# Patient Record
Sex: Male | Born: 1945
Health system: Southern US, Community
[De-identification: ages and names within clinical notes are randomized; demographics above are authoritative.]

## PROBLEM LIST (undated history)

## (undated) DIAGNOSIS — M1990 Unspecified osteoarthritis, unspecified site: Secondary | ICD-10-CM

---

## 2007-01-02 ENCOUNTER — Emergency Department (HOSPITAL_COMMUNITY): Admission: EM | Admit: 2007-01-02 | Discharge: 2007-01-03 | Payer: Self-pay | Admitting: Emergency Medicine

## 2010-06-09 ENCOUNTER — Emergency Department (HOSPITAL_COMMUNITY): Admission: EM | Admit: 2010-06-09 | Discharge: 2010-06-09 | Payer: Self-pay | Admitting: Emergency Medicine

## 2010-10-04 ENCOUNTER — Encounter: Payer: Self-pay | Admitting: *Deleted

## 2013-12-26 ENCOUNTER — Other Ambulatory Visit: Payer: Self-pay | Admitting: Family Medicine

## 2013-12-26 DIAGNOSIS — M545 Low back pain, unspecified: Secondary | ICD-10-CM

## 2013-12-30 ENCOUNTER — Ambulatory Visit
Admission: RE | Admit: 2013-12-30 | Discharge: 2013-12-30 | Disposition: A | Discharge status: Home / Self Care | Payer: Medicare Other | Source: Ambulatory Visit | Attending: Family Medicine | Admitting: Family Medicine

## 2013-12-30 DIAGNOSIS — M545 Low back pain, unspecified: Secondary | ICD-10-CM

## 2015-06-18 DIAGNOSIS — M4726 Other spondylosis with radiculopathy, lumbar region: Secondary | ICD-10-CM | POA: Diagnosis not present

## 2015-06-18 DIAGNOSIS — M9903 Segmental and somatic dysfunction of lumbar region: Secondary | ICD-10-CM | POA: Diagnosis not present

## 2015-06-25 DIAGNOSIS — M9903 Segmental and somatic dysfunction of lumbar region: Secondary | ICD-10-CM | POA: Diagnosis not present

## 2015-06-25 DIAGNOSIS — M4726 Other spondylosis with radiculopathy, lumbar region: Secondary | ICD-10-CM | POA: Diagnosis not present

## 2015-06-29 DIAGNOSIS — M4726 Other spondylosis with radiculopathy, lumbar region: Secondary | ICD-10-CM | POA: Diagnosis not present

## 2015-06-29 DIAGNOSIS — M9903 Segmental and somatic dysfunction of lumbar region: Secondary | ICD-10-CM | POA: Diagnosis not present

## 2015-06-30 DIAGNOSIS — M9903 Segmental and somatic dysfunction of lumbar region: Secondary | ICD-10-CM | POA: Diagnosis not present

## 2015-06-30 DIAGNOSIS — M4726 Other spondylosis with radiculopathy, lumbar region: Secondary | ICD-10-CM | POA: Diagnosis not present

## 2015-07-02 DIAGNOSIS — M9903 Segmental and somatic dysfunction of lumbar region: Secondary | ICD-10-CM | POA: Diagnosis not present

## 2015-07-02 DIAGNOSIS — M4726 Other spondylosis with radiculopathy, lumbar region: Secondary | ICD-10-CM | POA: Diagnosis not present

## 2015-07-06 DIAGNOSIS — M4726 Other spondylosis with radiculopathy, lumbar region: Secondary | ICD-10-CM | POA: Diagnosis not present

## 2015-07-06 DIAGNOSIS — M9903 Segmental and somatic dysfunction of lumbar region: Secondary | ICD-10-CM | POA: Diagnosis not present

## 2015-07-07 DIAGNOSIS — M4726 Other spondylosis with radiculopathy, lumbar region: Secondary | ICD-10-CM | POA: Diagnosis not present

## 2015-07-07 DIAGNOSIS — M9903 Segmental and somatic dysfunction of lumbar region: Secondary | ICD-10-CM | POA: Diagnosis not present

## 2015-07-09 DIAGNOSIS — M9903 Segmental and somatic dysfunction of lumbar region: Secondary | ICD-10-CM | POA: Diagnosis not present

## 2015-07-09 DIAGNOSIS — M4726 Other spondylosis with radiculopathy, lumbar region: Secondary | ICD-10-CM | POA: Diagnosis not present

## 2015-07-14 DIAGNOSIS — M9903 Segmental and somatic dysfunction of lumbar region: Secondary | ICD-10-CM | POA: Diagnosis not present

## 2015-07-14 DIAGNOSIS — M4726 Other spondylosis with radiculopathy, lumbar region: Secondary | ICD-10-CM | POA: Diagnosis not present

## 2015-07-15 DIAGNOSIS — M9903 Segmental and somatic dysfunction of lumbar region: Secondary | ICD-10-CM | POA: Diagnosis not present

## 2015-07-15 DIAGNOSIS — M4726 Other spondylosis with radiculopathy, lumbar region: Secondary | ICD-10-CM | POA: Diagnosis not present

## 2015-07-20 DIAGNOSIS — M4726 Other spondylosis with radiculopathy, lumbar region: Secondary | ICD-10-CM | POA: Diagnosis not present

## 2015-07-20 DIAGNOSIS — M9903 Segmental and somatic dysfunction of lumbar region: Secondary | ICD-10-CM | POA: Diagnosis not present

## 2015-07-21 DIAGNOSIS — M4726 Other spondylosis with radiculopathy, lumbar region: Secondary | ICD-10-CM | POA: Diagnosis not present

## 2015-07-21 DIAGNOSIS — M9903 Segmental and somatic dysfunction of lumbar region: Secondary | ICD-10-CM | POA: Diagnosis not present

## 2015-07-22 DIAGNOSIS — M4726 Other spondylosis with radiculopathy, lumbar region: Secondary | ICD-10-CM | POA: Diagnosis not present

## 2015-07-22 DIAGNOSIS — M9903 Segmental and somatic dysfunction of lumbar region: Secondary | ICD-10-CM | POA: Diagnosis not present

## 2017-01-25 ENCOUNTER — Emergency Department (HOSPITAL_COMMUNITY)
Admission: EM | Admit: 2017-01-25 | Discharge: 2017-01-25 | Disposition: A | Discharge status: Home / Self Care | Payer: Medicare Other | Attending: Emergency Medicine | Admitting: Emergency Medicine

## 2017-01-25 ENCOUNTER — Encounter (HOSPITAL_COMMUNITY): Payer: Self-pay | Admitting: Emergency Medicine

## 2017-01-25 DIAGNOSIS — R112 Nausea with vomiting, unspecified: Secondary | ICD-10-CM | POA: Insufficient documentation

## 2017-01-25 DIAGNOSIS — R110 Nausea: Secondary | ICD-10-CM

## 2017-01-25 DIAGNOSIS — R55 Syncope and collapse: Secondary | ICD-10-CM | POA: Diagnosis not present

## 2017-01-25 DIAGNOSIS — R531 Weakness: Secondary | ICD-10-CM | POA: Diagnosis not present

## 2017-01-25 DIAGNOSIS — R197 Diarrhea, unspecified: Secondary | ICD-10-CM | POA: Diagnosis not present

## 2017-01-25 DIAGNOSIS — R404 Transient alteration of awareness: Secondary | ICD-10-CM | POA: Diagnosis not present

## 2017-01-25 LAB — COMPREHENSIVE METABOLIC PANEL
ALT: 25 U/L (ref 17–63)
AST: 25 U/L (ref 15–41)
Albumin: 3.9 g/dL (ref 3.5–5.0)
Alkaline Phosphatase: 68 U/L (ref 38–126)
Anion gap: 7 (ref 5–15)
BUN: 16 mg/dL (ref 6–20)
CO2: 31 mmol/L (ref 22–32)
Calcium: 9.2 mg/dL (ref 8.9–10.3)
Chloride: 104 mmol/L (ref 101–111)
Creatinine, Ser: 1.19 mg/dL (ref 0.61–1.24)
GFR calc Af Amer: >60 mL/min (ref >60)
GFR calc non Af Amer: 60 mL/min — ABNORMAL LOW (ref >60)
Glucose, Bld: 110 mg/dL — ABNORMAL HIGH (ref 65–99)
Potassium: 3.7 mmol/L (ref 3.5–5.1)
Sodium: 142 mmol/L (ref 135–145)
Total Bilirubin: 0.5 mg/dL (ref 0.3–1.2)
Total Protein: 6.9 g/dL (ref 6.5–8.1)

## 2017-01-25 LAB — CBC WITH DIFFERENTIAL/PLATELET
Basophils Absolute: 0 10*3/uL (ref 0.0–0.1)
Basophils Relative: 0 %
Eosinophils Absolute: 0.1 10*3/uL (ref 0.0–0.7)
Eosinophils Relative: 1 %
HCT: 40.9 % (ref 39.0–52.0)
Hemoglobin: 14.3 g/dL (ref 13.0–17.0)
Lymphocytes Relative: 10 %
Lymphs Abs: 0.7 10*3/uL (ref 0.7–4.0)
MCH: 30.1 pg (ref 26.0–34.0)
MCHC: 35 g/dL (ref 30.0–36.0)
MCV: 86.1 fL (ref 78.0–100.0)
Monocytes Absolute: 0.5 10*3/uL (ref 0.1–1.0)
Monocytes Relative: 6 %
Neutro Abs: 6 10*3/uL (ref 1.7–7.7)
Neutrophils Relative %: 83 %
Platelets: 126 10*3/uL — ABNORMAL LOW (ref 150–400)
RBC: 4.75 MIL/uL (ref 4.22–5.81)
RDW: 13.6 % (ref 11.5–15.5)
WBC: 7.2 10*3/uL (ref 4.0–10.5)

## 2017-01-25 LAB — LIPASE, BLOOD: Lipase: 13 U/L (ref 11–51)

## 2017-01-25 MED ORDER — ONDANSETRON HCL 4 MG/2ML IJ SOLN
4.0000 mg | Freq: Once | INTRAMUSCULAR | Status: DC
  Filled 2017-01-25: qty 2

## 2017-01-25 MED ORDER — SODIUM CHLORIDE 0.9 % IV BOLUS (SEPSIS)
1000.0000 mL | Freq: Once | INTRAVENOUS | Status: AC
  Administered 2017-01-25: 1000 mL via INTRAVENOUS

## 2017-01-25 MED ORDER — ONDANSETRON 8 MG PO TBDP: 8.0000 mg | ORAL_TABLET | Freq: Three times a day (TID) | ORAL | 0 refills | Status: DC | PRN

## 2017-01-25 NOTE — ED Notes (Signed)
Bed: ZO10WA22 Expected date:  Expected time:  Means of arrival:  Comments: EMS/weak

## 2017-01-25 NOTE — ED Triage Notes (Signed)
Per [facility] pt from [facility] for [facility] problem began feeling dizzy and had a couple episodes of emesis. Pt denies chest pain or shortness of breath. CBG 126

## 2017-01-25 NOTE — ED Provider Notes (Signed)
[facility]-EMERGENCY DEPT Provider Note   CSN: [id number] Arrival date & time: 01/25/17  1228     History   Chief Complaint Chief Complaint  Patient presents with  . Emesis  . Dizziness    HPI Gerald Whitaker is a 71 y.o. male.  71 year old male presents with acute onset of nausea and diarrhea that occurred at the physical therapy. States that he was to initiate usual physical therapy for his right hip when he came nauseated. Sent on the toilet and stated that he had emesis as well as diarrhea at the same time. States that he has been constipated for several days but took a laxative and states that he had a good result today. Patient has had this before in the past without diagnosis. Denies any fever or chills. Denies any actual abdominal pain.      History reviewed. No pertinent past medical history.  There are no active problems to display for this patient.   History reviewed. No pertinent surgical history.     Home Medications    Prior to Admission medications   Not on File    Family History No family history on file.  Social History Social History  Substance Use Topics  . Smoking status: Not on file  . Smokeless tobacco: Not on file  . Alcohol use Not on file     Allergies   Patient has no known allergies.   Review of Systems Review of Systems  All other systems reviewed and are negative.    Physical Exam Updated Vital Signs BP 132/67 (BP Location: Right Arm)   Pulse 65   Temp 97.5 F (36.4 C)   Ht 6' (1.829 m)   Wt 90.7 kg   SpO2 96%   BMI 27.12 kg/m   Physical Exam  Constitutional: He is oriented to person, place, and time. He appears well-developed and well-nourished.  Non-toxic appearance. No distress.  HENT:  Head: Normocephalic and atraumatic.  Eyes: Conjunctivae, EOM and lids are normal. Pupils are equal, round, and reactive to light.  Neck: Normal range of motion. Neck supple. No tracheal deviation present. No thyroid mass present.    Cardiovascular: Normal rate, regular rhythm and normal heart sounds.  Exam reveals no gallop.   No murmur heard. Pulmonary/Chest: Effort normal and breath sounds normal. No stridor. No respiratory distress. He has no decreased breath sounds. He has no wheezes. He has no rhonchi. He has no rales.  Abdominal: Soft. Normal appearance and bowel sounds are normal. He exhibits no distension. There is no tenderness. There is no rebound and no CVA tenderness.  Musculoskeletal: Normal range of motion. He exhibits no edema or tenderness.  Neurological: He is alert and oriented to person, place, and time. He has normal strength. No cranial nerve deficit or sensory deficit. GCS eye subscore is 4. GCS verbal subscore is 5. GCS motor subscore is 6.  Skin: Skin is warm and dry. No abrasion and no rash noted.  Psychiatric: He has a normal mood and affect. His speech is normal and behavior is normal.  Nursing note and vitals reviewed.    ED Treatments / Results  Labs (all labs ordered are listed, but only abnormal results are displayed) Labs Reviewed  CBC WITH DIFFERENTIAL/PLATELET  COMPREHENSIVE METABOLIC PANEL  LIPASE, BLOOD    EKG  EKG Interpretation None       Radiology No results found.  Procedures Procedures (including critical care time)  Medications Ordered in ED Medications  sodium chloride 0.9 %  bolus 1,000 mL (not administered)  ondansetron (ZOFRAN) injection 4 mg (not administered)     Initial Impression / Assessment and Plan / ED Course  I have reviewed the triage vital signs and the nursing notes.  Pertinent labs & imaging results that were available during my care of the patient were reviewed by me and considered in my medical decision making (see chart for details).     Patient given IV fluids here feels better. Abdominal exam is benign. Labs are reassuring. Stable for discharge  Final Clinical Impressions(s) / ED Diagnoses   Final diagnoses:  None    New  Prescriptions New Prescriptions   No medications on file     Lorre NickAllen, Yuliza Cara, MD 01/25/17 1506

## 2017-05-31 DIAGNOSIS — M4726 Other spondylosis with radiculopathy, lumbar region: Secondary | ICD-10-CM | POA: Diagnosis not present

## 2017-05-31 DIAGNOSIS — M9903 Segmental and somatic dysfunction of lumbar region: Secondary | ICD-10-CM | POA: Diagnosis not present

## 2017-06-01 DIAGNOSIS — M9903 Segmental and somatic dysfunction of lumbar region: Secondary | ICD-10-CM | POA: Diagnosis not present

## 2017-06-01 DIAGNOSIS — M4726 Other spondylosis with radiculopathy, lumbar region: Secondary | ICD-10-CM | POA: Diagnosis not present

## 2017-06-08 DIAGNOSIS — M9903 Segmental and somatic dysfunction of lumbar region: Secondary | ICD-10-CM | POA: Diagnosis not present

## 2017-06-08 DIAGNOSIS — M4726 Other spondylosis with radiculopathy, lumbar region: Secondary | ICD-10-CM | POA: Diagnosis not present

## 2017-08-18 DIAGNOSIS — M160 Bilateral primary osteoarthritis of hip: Secondary | ICD-10-CM | POA: Diagnosis not present

## 2017-11-22 DIAGNOSIS — M62830 Muscle spasm of back: Secondary | ICD-10-CM | POA: Diagnosis not present

## 2017-11-22 DIAGNOSIS — M9904 Segmental and somatic dysfunction of sacral region: Secondary | ICD-10-CM | POA: Diagnosis not present

## 2017-11-22 DIAGNOSIS — M25552 Pain in left hip: Secondary | ICD-10-CM | POA: Diagnosis not present

## 2017-11-22 DIAGNOSIS — M25551 Pain in right hip: Secondary | ICD-10-CM | POA: Diagnosis not present

## 2017-11-22 DIAGNOSIS — M1612 Unilateral primary osteoarthritis, left hip: Secondary | ICD-10-CM | POA: Diagnosis not present

## 2019-08-05 DIAGNOSIS — K30 Functional dyspepsia: Secondary | ICD-10-CM | POA: Diagnosis not present

## 2019-08-05 DIAGNOSIS — Z125 Encounter for screening for malignant neoplasm of prostate: Secondary | ICD-10-CM | POA: Diagnosis not present

## 2019-08-05 DIAGNOSIS — M25559 Pain in unspecified hip: Secondary | ICD-10-CM | POA: Diagnosis not present

## 2019-08-05 DIAGNOSIS — Z1331 Encounter for screening for depression: Secondary | ICD-10-CM | POA: Diagnosis not present

## 2019-08-05 DIAGNOSIS — N401 Enlarged prostate with lower urinary tract symptoms: Secondary | ICD-10-CM | POA: Diagnosis not present

## 2019-08-05 DIAGNOSIS — H9319 Tinnitus, unspecified ear: Secondary | ICD-10-CM | POA: Diagnosis not present

## 2019-08-05 DIAGNOSIS — M545 Low back pain: Secondary | ICD-10-CM | POA: Diagnosis not present

## 2019-08-05 DIAGNOSIS — R300 Dysuria: Secondary | ICD-10-CM | POA: Diagnosis not present

## 2019-08-05 DIAGNOSIS — R7989 Other specified abnormal findings of blood chemistry: Secondary | ICD-10-CM | POA: Diagnosis not present

## 2019-08-06 DIAGNOSIS — Z125 Encounter for screening for malignant neoplasm of prostate: Secondary | ICD-10-CM | POA: Diagnosis not present

## 2019-08-12 DIAGNOSIS — R82998 Other abnormal findings in urine: Secondary | ICD-10-CM | POA: Diagnosis not present

## 2019-08-23 ENCOUNTER — Ambulatory Visit (INDEPENDENT_AMBULATORY_CARE_PROVIDER_SITE_OTHER): Payer: Medicare HMO

## 2019-08-23 ENCOUNTER — Ambulatory Visit (INDEPENDENT_AMBULATORY_CARE_PROVIDER_SITE_OTHER): Payer: Medicare HMO | Admitting: Orthopaedic Surgery

## 2019-08-23 ENCOUNTER — Other Ambulatory Visit: Payer: Self-pay

## 2019-08-23 VITALS — Ht 72.0 in | Wt 195.0 lb

## 2019-08-23 DIAGNOSIS — M160 Bilateral primary osteoarthritis of hip: Secondary | ICD-10-CM

## 2019-08-23 DIAGNOSIS — M1611 Unilateral primary osteoarthritis, right hip: Secondary | ICD-10-CM | POA: Diagnosis not present

## 2019-08-23 DIAGNOSIS — M1612 Unilateral primary osteoarthritis, left hip: Secondary | ICD-10-CM

## 2019-08-23 NOTE — Progress Notes (Signed)
Office Visit Note   Patient: Gerald Whitaker           Date of Birth: 1946/02/18           MRN: [id number] Visit Date: 08/23/2019              Requested by: Iona Beard, [address] [place],  [place] [address] PCP: Iona Beard, MD   Assessment & Plan: Visit Diagnoses:  1. Primary osteoarthritis of left hip   2. Primary osteoarthritis of right hip     Plan: Impression is severe bilateral hip DJD worse on the left.  I reviewed the x-rays with the patient in detail today and at this point I do believe that he has no other good option for pain relief other than a hip replacement.  We will begin with the left side first.  Based on discussion and the failure of extensive conservative treatment over the last 3 to 4 years patient has elected to proceed with scheduling for a left total hip replacement.  We will obtain preoperative medical clearance from PCP first.  We have noted that he is a Jehovah's Witness.  We also discussed possibility of same-day discharge from the PACU pending PT evaluation.  Questions encouraged and answered.    Follow-Up Instructions: Return if symptoms worsen or fail to improve.   Orders:  Orders Placed This Encounter  Procedures  . XR HIPS BILAT W OR W/O PELVIS 3-4 VIEWS   No orders of the defined types were placed in this encounter.     Procedures: No procedures performed   Clinical Data: No additional findings.   Subjective: Chief Complaint  Patient presents with  . Right Hip - Pain  . Left Hip - Pain    Gerald Whitaker is a very pleasant 73 year old gentleman comes in for evaluation of bilateral hip pain for the last 3 to 4 years.  He has a brother Gerald Whitaker on whom I performed a joint replacement.  The patient states that he has had chronic bilateral hip pain and he has resisted having hip placement.  He has undergone years of physical therapy and chiropractic care.  He has tried stem cell therapy.  He has tried limiting his activities.  He  has tried walking with a cane.  He endorses significant pain and stiffness in his hips and groin area.  He does have a little bit of back pain with secondary to the hip disease.   Review of Systems  Constitutional: Negative.   All other systems reviewed and are negative.    Objective: Vital Signs: Ht 6' (1.829 m)   Wt 195 lb (88.5 kg)   BMI 26.45 kg/m   Physical Exam Vitals and nursing note reviewed.  Constitutional:      Appearance: He is well-developed.  HENT:     Head: Normocephalic and atraumatic.  Eyes:     Pupils: Pupils are equal, round, and reactive to light.  Pulmonary:     Effort: Pulmonary effort is normal.  Abdominal:     Palpations: Abdomen is soft.  Musculoskeletal:        General: Normal range of motion.     Cervical back: Neck supple.  Skin:    General: Skin is warm.  Neurological:     Mental Status: He is alert and oriented to person, place, and time.  Psychiatric:        Behavior: Behavior normal.        Thought Content:  Thought content normal.        Judgment: Judgment normal.     Ortho Exam Bilateral hip exam show essentially no range of motion.  He has significant pain with any attempted movement.  He has significant stiffness.  He has severe difficulty with going from sit to stand from a chair. Specialty Comments:  No specialty comments available.  Imaging: XR HIPS BILAT W OR W/O PELVIS 3-4 VIEWS  Result Date: 08/23/2019 Severe bilateral hip DJD.  Large femoral head osteophyte inferiorly.  Mild lateral subluxation of the right femoral head.    PMFS History: Patient Active Problem List   Diagnosis Date Noted  . Primary osteoarthritis of left hip 08/23/2019  . Primary osteoarthritis of right hip 08/23/2019   No past medical history on file.  No family history on file.  No past surgical history on file. Social History   Occupational History  . Not on file  Tobacco Use  . Smoking status: Not on file  Substance and Sexual Activity    . Alcohol use: Not on file  . Drug use: Not on file  . Sexual activity: Not on file

## 2019-09-04 DIAGNOSIS — Z1212 Encounter for screening for malignant neoplasm of rectum: Secondary | ICD-10-CM | POA: Diagnosis not present

## 2019-09-17 ENCOUNTER — Other Ambulatory Visit: Payer: Self-pay

## 2019-09-25 NOTE — Progress Notes (Signed)
[facility] - [place], [place] - [address] [facility]. [address] [place] [address] Phone: [phone] Fax: [phone]      Your procedure is scheduled on Monday, September 30, 2019.  Report to [facility] Main Entrance "A" at 10:45 A.M., and check in at the Admitting office.  Call this number if you have problems the morning of surgery:  [phone]  Call [phone] if you have any questions prior to your surgery date Monday-Friday 8am-4pm    Remember:  Do not eat or drink after midnight the night before your surgery     Take these medicines the morning of surgery with A SIP OF WATER: NONE  7 days prior to surgery STOP taking any Aspirin (unless otherwise instructed by your surgeon), Aleve, Naproxen, Ibuprofen, Motrin, Advil, Goody's, BC's, all herbal medications, fish oil, and all vitamins.    The Morning of Surgery  Do not wear jewelry.  Do not wear lotions, powders, colognes, or deodorant  Men may shave face and neck.  Do not bring valuables to the hospital.  [facility] is not responsible for any belongings or valuables.  If you are a smoker, DO NOT Smoke 24 hours prior to surgery  If you wear a CPAP at night please bring your mask, tubing, and machine the morning of surgery   Remember that you must have someone to transport you home after your surgery, and remain with you for 24 hours if you are discharged the same day.   Please bring cases for contacts, glasses, hearing aids, dentures or bridgework because it cannot be worn into surgery.    Leave your suitcase in the car.  After surgery it may be brought to your room.  For patients admitted to the hospital, discharge time will be determined by your treatment team.  Patients discharged the day of surgery will not be allowed to drive home.    Special instructions:   [place]- Preparing For Surgery  Before surgery, you can play an important role. Because skin is not  sterile, your skin needs to be as free of germs as possible. You can reduce the number of germs on your skin by washing with CHG (chlorahexidine gluconate) Soap before surgery.  CHG is an antiseptic cleaner which kills germs and bonds with the skin to continue killing germs even after washing.    Oral Hygiene is also important to reduce your risk of infection.  Remember - BRUSH YOUR TEETH THE MORNING OF SURGERY WITH YOUR REGULAR TOOTHPASTE  Please do not use if you have an allergy to CHG or antibacterial soaps. If your skin becomes reddened/irritated stop using the CHG.  Do not shave (including legs and underarms) for at least 48 hours prior to first CHG shower. It is OK to shave your face.  Please follow these instructions carefully.   1. Shower the NIGHT BEFORE SURGERY and the MORNING OF SURGERY with CHG Soap.   2. If you chose to wash your hair, wash your hair first as usual with your normal shampoo.  3. After you shampoo, rinse your hair and body thoroughly to remove the shampoo.  4. Use CHG as you would any other liquid soap. You can apply CHG directly to the skin and wash gently with a scrungie or a clean washcloth.   5. Apply the CHG Soap to your body ONLY FROM THE NECK DOWN.  Do not use on open wounds or open sores. Avoid contact with your eyes, ears,  mouth and genitals (private parts). Wash Face and genitals (private parts)  with your normal soap.   6. Wash thoroughly, paying special attention to the area where your surgery will be performed.  7. Thoroughly rinse your body with warm water from the neck down.  8. DO NOT shower/wash with your normal soap after using and rinsing off the CHG Soap.  9. Pat yourself dry with a CLEAN TOWEL.  10. Wear CLEAN PAJAMAS to bed the night before surgery, wear comfortable clothes the morning of surgery  11. Place CLEAN SHEETS on your bed the night of your first shower and DO NOT SLEEP WITH PETS.    Day of Surgery:  Please shower the  morning of surgery with the CHG soap Do not apply any deodorants/lotions. Please wear clean clothes to the hospital/surgery center.   Remember to brush your teeth WITH YOUR REGULAR TOOTHPASTE.   Please read over the following fact sheets that you were given.

## 2019-09-26 ENCOUNTER — Other Ambulatory Visit (HOSPITAL_COMMUNITY)
Admission: RE | Admit: 2019-09-26 | Discharge: 2019-09-26 | Disposition: A | Discharge status: Home / Self Care | Payer: Medicare HMO | Source: Ambulatory Visit | Attending: Orthopaedic Surgery | Admitting: Orthopaedic Surgery

## 2019-09-26 ENCOUNTER — Encounter (HOSPITAL_COMMUNITY): Payer: Self-pay

## 2019-09-26 ENCOUNTER — Other Ambulatory Visit: Payer: Self-pay

## 2019-09-26 ENCOUNTER — Encounter (HOSPITAL_COMMUNITY)
Admission: RE | Admit: 2019-09-26 | Discharge: 2019-09-26 | Disposition: A | Discharge status: Home / Self Care | Payer: Medicare HMO | Source: Ambulatory Visit | Attending: Orthopaedic Surgery | Admitting: Orthopaedic Surgery

## 2019-09-26 ENCOUNTER — Ambulatory Visit (HOSPITAL_COMMUNITY)
Admission: RE | Admit: 2019-09-26 | Discharge: 2019-09-26 | Disposition: A | Discharge status: Home / Self Care | Payer: Medicare HMO | Source: Ambulatory Visit | Attending: Physician Assistant | Admitting: Physician Assistant

## 2019-09-26 DIAGNOSIS — Z20822 Contact with and (suspected) exposure to covid-19: Secondary | ICD-10-CM | POA: Insufficient documentation

## 2019-09-26 DIAGNOSIS — Z01818 Encounter for other preprocedural examination: Secondary | ICD-10-CM | POA: Diagnosis not present

## 2019-09-26 DIAGNOSIS — M1612 Unilateral primary osteoarthritis, left hip: Secondary | ICD-10-CM

## 2019-09-26 HISTORY — DX: Unspecified osteoarthritis, unspecified site: M19.90

## 2019-09-26 LAB — CBC WITH DIFFERENTIAL/PLATELET
Abs Immature Granulocytes: 0.01 10*3/uL (ref 0.00–0.07)
Basophils Absolute: 0.1 10*3/uL (ref 0.0–0.1)
Basophils Relative: 1 %
Eosinophils Absolute: 0.1 10*3/uL (ref 0.0–0.5)
Eosinophils Relative: 3 %
HCT: 44.3 % (ref 39.0–52.0)
Hemoglobin: 14.6 g/dL (ref 13.0–17.0)
Immature Granulocytes: 0 %
Lymphocytes Relative: 31 %
Lymphs Abs: 1.4 10*3/uL (ref 0.7–4.0)
MCH: 29.9 pg (ref 26.0–34.0)
MCHC: 33 g/dL (ref 30.0–36.0)
MCV: 90.6 fL (ref 80.0–100.0)
Monocytes Absolute: 0.4 10*3/uL (ref 0.1–1.0)
Monocytes Relative: 8 %
Neutro Abs: 2.6 10*3/uL (ref 1.7–7.7)
Neutrophils Relative %: 57 %
Platelets: 142 10*3/uL — ABNORMAL LOW (ref 150–400)
RBC: 4.89 MIL/uL (ref 4.22–5.81)
RDW: 13.6 % (ref 11.5–15.5)
WBC: 4.6 10*3/uL (ref 4.0–10.5)
nRBC: 0 % (ref 0.0–0.2)

## 2019-09-26 LAB — COMPREHENSIVE METABOLIC PANEL
ALT: 25 U/L (ref 0–44)
AST: 28 U/L (ref 15–41)
Albumin: 4.1 g/dL (ref 3.5–5.0)
Alkaline Phosphatase: 74 U/L (ref 38–126)
Anion gap: 8 (ref 5–15)
BUN: 12 mg/dL (ref 8–23)
CO2: 31 mmol/L (ref 22–32)
Calcium: 9.8 mg/dL (ref 8.9–10.3)
Chloride: 102 mmol/L (ref 98–111)
Creatinine, Ser: 1.27 mg/dL — ABNORMAL HIGH (ref 0.61–1.24)
GFR calc Af Amer: >60 mL/min (ref >60)
GFR calc non Af Amer: 55 mL/min — ABNORMAL LOW (ref >60)
Glucose, Bld: 89 mg/dL (ref 70–99)
Potassium: 4.3 mmol/L (ref 3.5–5.1)
Sodium: 141 mmol/L (ref 135–145)
Total Bilirubin: 0.8 mg/dL (ref 0.3–1.2)
Total Protein: 7.3 g/dL (ref 6.5–8.1)

## 2019-09-26 LAB — PROTIME-INR
INR: 1 (ref 0.8–1.2)
Prothrombin Time: 13.2 seconds (ref 11.4–15.2)

## 2019-09-26 LAB — SURGICAL PCR SCREEN
MRSA, PCR: NEGATIVE
Staphylococcus aureus: NEGATIVE

## 2019-09-26 LAB — APTT: aPTT: 28 seconds (ref 24–36)

## 2019-09-26 NOTE — Progress Notes (Signed)
[place], [place] - [address] [facility]. [address] [place] [address] Phone: [phone] Fax: [phone]      Your procedure is scheduled on Monday, September 30, 2019.  Report to [facility] Main Entrance "A" at 10:45 A.M., and check in at the Admitting office.  Call this number if you have problems the morning of surgery:  [phone]  Call [phone] if you have any questions prior to your surgery date Monday-Friday 8am-4pm    Remember:  Do not eat after midnight the night before your surgery   DO NOT take any medications DAY OF SURGERY.      Enhanced Recovery after Surgery for Orthopedics Enhanced Recovery after Surgery is a protocol used to improve the stress on your body and your recovery after surgery.  Patient Instructions  . The night before surgery:  o No food after midnight. ONLY clear liquids after midnight  .  Marland Kitchen The day of surgery (if you do NOT have diabetes):  o Drink ONE (1) Pre-Surgery Clear Ensure as directed.   o This drink was given to you during your hospital  pre-op appointment visit. o The pre-op nurse will instruct you on the time to drink the  Pre-Surgery Ensure depending on your surgery time. o Finish the drink by 0945 am morning of surgery o Nothing else to drink after completing the  Pre-Surgery Clear Ensure.         If you have questions, please contact your surgeon's office.   7 days prior to surgery STOP taking any Aspirin (unless otherwise instructed by your surgeon), Aleve, Naproxen, Ibuprofen, Motrin, Advil, Goody's, BC's, all herbal medications, fish oil, and all vitamins.    The Morning of Surgery  Do not wear jewelry.  Do not wear lotions, powders, colognes, or deodorant  Men may shave face and neck.  Do not bring valuables to the hospital.  [facility] is not responsible for any belongings or valuables.  If you are a smoker, DO NOT Smoke 24 hours prior to surgery  If  you wear a CPAP at night please bring your mask, tubing, and machine the morning of surgery   Remember that you must have someone to transport you home after your surgery, and remain with you for 24 hours if you are discharged the same day.   Please bring cases for contacts, glasses, hearing aids, dentures or bridgework because it cannot be worn into surgery.    Leave your suitcase in the car.  After surgery it may be brought to your room.  For patients admitted to the hospital, discharge time will be determined by your treatment team.  Patients discharged the day of surgery will not be allowed to drive home.    Special instructions:   [place]- Preparing For Surgery  Before surgery, you can play an important role. Because skin is not sterile, your skin needs to be as free of germs as possible. You can reduce the number of germs on your skin by washing with CHG (chlorahexidine gluconate) Soap before surgery.  CHG is an antiseptic cleaner which kills germs and bonds with the skin to continue killing germs even after washing.    Oral Hygiene is also important to reduce your risk of infection.  Remember - BRUSH YOUR TEETH THE MORNING OF SURGERY WITH YOUR REGULAR TOOTHPASTE  Please do not use if you have an allergy to CHG or antibacterial soaps. If your skin becomes reddened/irritated stop using the  CHG.  Do not shave (including legs and underarms) for at least 48 hours prior to first CHG shower. It is OK to shave your face.  Please follow these instructions carefully.   1. Shower the NIGHT BEFORE SURGERY and the MORNING OF SURGERY with CHG Soap.   2. If you chose to wash your hair, wash your hair first as usual with your normal shampoo.  3. After you shampoo, rinse your hair and body thoroughly to remove the shampoo.  4. Use CHG as you would any other liquid soap. You can apply CHG directly to the skin and wash gently with a scrungie or a clean washcloth.   5. Apply the CHG Soap to  your body ONLY FROM THE NECK DOWN.  Do not use on open wounds or open sores. Avoid contact with your eyes, ears, mouth and genitals (private parts). Wash Face and genitals (private parts)  with your normal soap.   6. Wash thoroughly, paying special attention to the area where your surgery will be performed.  7. Thoroughly rinse your body with warm water from the neck down.  8. DO NOT shower/wash with your normal soap after using and rinsing off the CHG Soap.  9. Pat yourself dry with a CLEAN TOWEL.  10. Wear CLEAN PAJAMAS to bed the night before surgery, wear comfortable clothes the morning of surgery  11. Place CLEAN SHEETS on your bed the night of your first shower and DO NOT SLEEP WITH PETS.    Day of Surgery:  Please shower the morning of surgery with the CHG soap Do not apply any deodorants/lotions. Please wear clean clothes to the hospital/surgery center.   Remember to brush your teeth WITH YOUR REGULAR TOOTHPASTE.   Please read over the following fact sheets that you were given.

## 2019-09-26 NOTE — Progress Notes (Signed)
PCP - Tiscovec MD   Chest x-ray - 09/26/19 EKG - 09/26/19 Stress Test -  ECHO -  Cardiac Cath -    ERAS Protcol - yes  PRE-SURGERY Ensure or G2- Ensure given  COVID TEST- 09/26/19   Anesthesia review: yes - documents requested for echo, stress test, and cath  Patient denies shortness of breath, fever, cough and chest pain at PAT appointment   All instructions explained to the patient, with a verbal understanding of the material. Patient agrees to go over the instructions while at home for a better understanding. Patient also instructed to self quarantine after being tested for COVID-19. The opportunity to ask questions was provided.

## 2019-09-27 ENCOUNTER — Telehealth: Payer: Self-pay | Admitting: *Deleted

## 2019-09-27 LAB — NOVEL CORONAVIRUS, NAA (HOSP ORDER, SEND-OUT TO REF LAB; TAT 18-24 HRS): SARS-CoV-2, NAA: NOT DETECTED

## 2019-09-27 MED ORDER — TRANEXAMIC ACID 1000 MG/10ML IV SOLN
2000.0000 mg | INTRAVENOUS | Status: DC
  Filled 2019-09-27 (×2): qty 20

## 2019-09-27 MED ORDER — BUPIVACAINE LIPOSOME 1.3 % IJ SUSP
10.0000 mL | Freq: Once | INTRAMUSCULAR | Status: DC
  Filled 2019-09-27: qty 10

## 2019-09-27 NOTE — Anesthesia Preprocedure Evaluation (Addendum)
Anesthesia Evaluation  Patient identified by MRN, date of birth, ID band Patient awake    Reviewed: Allergy & Precautions, NPO status , Patient's Chart, lab work & pertinent test results  Airway Mallampati: II  TM Distance: >3 FB Neck ROM: Full    Dental  (+) Dental Advisory Given   Pulmonary former smoker,    breath sounds clear to auscultation       Cardiovascular negative cardio ROS   Rhythm:Regular Rate:Normal     Neuro/Psych negative neurological ROS     GI/Hepatic negative GI ROS, Neg liver ROS,   Endo/Other  negative endocrine ROS  Renal/GU negative Renal ROS     Musculoskeletal  (+) Arthritis ,   Abdominal   Peds  Hematology  (+) JEHOVAH'S WITNESS  Anesthesia Other Findings   Reproductive/Obstetrics                             Anesthesia Physical Anesthesia Plan  ASA: II  Anesthesia Plan: Spinal   Post-op Pain Management:    Induction:   PONV Risk Score and Plan: 1 and Propofol infusion, Ondansetron and Treatment may vary due to age or medical condition  Airway Management Planned: Natural Airway and Simple Face Mask  Additional Equipment:   Intra-op Plan:   Post-operative Plan:   Informed Consent: I have reviewed the patients History and Physical, chart, labs and discussed the procedure including the risks, benefits and alternatives for the proposed anesthesia with the patient or authorized representative who has indicated his/her understanding and acceptance.       Plan Discussed with: CRNA  Anesthesia Plan Comments: (Pt will accept albumin. No blood products otherwise.)       Anesthesia Quick Evaluation

## 2019-09-27 NOTE — Telephone Encounter (Signed)
RNCM call to patient to discuss his upcoming Left THA with Dr. Roda Shutters on Monday, 09/30/19. CM was requested to assist with any discharge needs due to possible same day discharge from hospital on date of surgery. Patient verbalized he has a wife, who will be assisting at home. Anticipate HHPT will be needed. Choice provided and referral made to [facility]. Discussed DME and patient has a 3in1/BSC at home and has borrowed a FWW from a family member. No DME needed. F/U appointment scheduled for 10/15/19 at 10:15 am. Will continue to follow for CM needs.

## 2019-09-27 NOTE — Progress Notes (Signed)
Anesthesia Chart Review:  Case: [id number] Date/Time: 09/30/19 1230   Procedure: LEFT TOTAL HIP ARTHROPLASTY ANTERIOR APPROACH (Left Hip)   Anesthesia type: Spinal   Pre-op diagnosis: left hip degenerative joint disease   Location: [facility] OR ROOM 04 / [facility] OR   Surgeons: Tarry Kos, MD      DISCUSSION: Patient is a 74 year old male scheduled for the above procedure.  History includes former smoker (quit 1994), arthritis.  I called and spoke with Mr. Mclinden to inquire about history of possible previous cardiac testing per PAT RN notes. He reported a "good" stress test in 2004 at the [facility]. He denied known CAD. He denied chest pain and SOB. Used to more active and tend to his gardens, but says he has been more limited since ~ 04/2018 due to hip and back pain. (Back pain somewhat improved after Chiropractor visits. Had fall ~ 8 years ago.) No orthopnea. Mild dependent edema after up all day that resolves with elevation at night. Had evaluation by local PCP Dr. Wylene Simmer on 08/05/19 and was medically cleared for procedure.   07/26/20 COVID-19 test is in process. If negative, and otherwise no acute changes then I would anticipate that he may proceed as planned.   VS: BP 140/85   Pulse 76   Temp 37 C (Oral)   Resp 17   Ht 6' (1.829 m)   Wt 90.5 kg   SpO2 98%   BMI 27.06 kg/m   PROVIDERS: Tisovec, Adelfa Koh, MD is PCP   LABS: Labs reviewed: Acceptable for surgery. (all labs ordered are listed, but only abnormal results are displayed)  Labs Reviewed  CBC WITH DIFFERENTIAL/PLATELET - Abnormal; Notable for the following components:      Result Value   Platelets 142 (*)    All other components within normal limits  COMPREHENSIVE METABOLIC PANEL - Abnormal; Notable for the following components:   Creatinine, Ser 1.27 (*)    GFR calc non Af Amer 55 (*)    All other components within normal limits  SURGICAL PCR SCREEN  APTT  PROTIME-INR  TYPE AND SCREEN  ABO/RH     IMAGES: CXR  09/26/19: FINDINGS: Lung volumes are normal. No consolidative airspace disease. No pleural effusions. No pneumothorax. No pulmonary nodule or mass noted. Pulmonary vasculature and the cardiomediastinal silhouette are within normal limits. IMPRESSION: No radiographic evidence of acute cardiopulmonary disease.   EKG: 09/26/19: Sinus bradycardia Nonspecific T wave abnormality Abnormal ECG No old tracing to compare Confirmed by Kristeen Miss [id number]) on 09/26/2019 9:28:01 PM   CV: He reports what sounds like an admission for chest pain in 2004 at the [facility]. He says he was admitted overnight for a stress test and was told results were "good."     Past Medical History:  Diagnosis Date  . Arthritis     History reviewed. No pertinent surgical history.  MEDICATIONS: . MAGNESIUM PO  . Multiple Vitamin (MULTIVITAMIN WITH MINERALS) TABS tablet  . ondansetron (ZOFRAN ODT) 8 MG disintegrating tablet   No current facility-administered medications for this encounter.   Melene Muller ON 09/30/2019] bupivacaine liposome (EXPAREL) 1.3 % injection 133 mg  . [START ON 09/30/2019] tranexamic acid (CYKLOKAPRON) 2,000 mg in sodium chloride 0.9 % 50 mL Topical Application    Shonna Chock, PA-C Surgical Short Stay/Anesthesiology [facility] Phone [phone] [facility] Phone [phone] 09/27/2019 11:38 AM

## 2019-09-28 ENCOUNTER — Other Ambulatory Visit: Payer: Self-pay | Admitting: Orthopaedic Surgery

## 2019-09-28 MED ORDER — ONDANSETRON HCL 4 MG PO TABS: 4.0000 mg | ORAL_TABLET | Freq: Three times a day (TID) | ORAL | 0 refills | Status: DC | PRN

## 2019-09-28 MED ORDER — OXYCODONE HCL ER 10 MG PO T12A: 10.0000 mg | EXTENDED_RELEASE_TABLET | Freq: Two times a day (BID) | ORAL | 0 refills | Status: AC

## 2019-09-28 MED ORDER — ASPIRIN EC 81 MG PO TBEC: 81.0000 mg | DELAYED_RELEASE_TABLET | Freq: Two times a day (BID) | ORAL | 0 refills | Status: DC

## 2019-09-28 MED ORDER — METHOCARBAMOL 750 MG PO TABS: 750.0000 mg | ORAL_TABLET | Freq: Two times a day (BID) | ORAL | 3 refills | Status: DC | PRN

## 2019-09-28 MED ORDER — OXYCODONE-ACETAMINOPHEN 5-325 MG PO TABS: 1.0000 | ORAL_TABLET | Freq: Three times a day (TID) | ORAL | 0 refills | Status: DC | PRN

## 2019-09-30 ENCOUNTER — Ambulatory Visit (HOSPITAL_COMMUNITY): Payer: Medicare HMO

## 2019-09-30 ENCOUNTER — Ambulatory Visit (HOSPITAL_COMMUNITY): Payer: Medicare HMO | Admitting: Anesthesiology

## 2019-09-30 ENCOUNTER — Ambulatory Visit (HOSPITAL_COMMUNITY): Payer: Medicare HMO | Admitting: Vascular Surgery

## 2019-09-30 ENCOUNTER — Observation Stay (HOSPITAL_COMMUNITY)
Admission: RE | Admit: 2019-09-30 | Discharge: 2019-10-01 | Disposition: A | Discharge status: Home / Self Care | Payer: Medicare HMO | Attending: Orthopaedic Surgery | Admitting: Orthopaedic Surgery

## 2019-09-30 ENCOUNTER — Other Ambulatory Visit: Payer: Self-pay

## 2019-09-30 ENCOUNTER — Encounter (HOSPITAL_COMMUNITY): Payer: Self-pay | Admitting: Orthopaedic Surgery

## 2019-09-30 ENCOUNTER — Observation Stay (HOSPITAL_COMMUNITY): Payer: Medicare HMO

## 2019-09-30 ENCOUNTER — Encounter (HOSPITAL_COMMUNITY)
Admission: RE | Disposition: A | Discharge status: Home / Self Care | Payer: Self-pay | Source: Home / Self Care | Attending: Orthopaedic Surgery

## 2019-09-30 DIAGNOSIS — Z87891 Personal history of nicotine dependence: Secondary | ICD-10-CM | POA: Insufficient documentation

## 2019-09-30 DIAGNOSIS — Z7982 Long term (current) use of aspirin: Secondary | ICD-10-CM | POA: Diagnosis not present

## 2019-09-30 DIAGNOSIS — Z419 Encounter for procedure for purposes other than remedying health state, unspecified: Secondary | ICD-10-CM

## 2019-09-30 DIAGNOSIS — Z96649 Presence of unspecified artificial hip joint: Secondary | ICD-10-CM

## 2019-09-30 DIAGNOSIS — Z96642 Presence of left artificial hip joint: Secondary | ICD-10-CM

## 2019-09-30 DIAGNOSIS — Z471 Aftercare following joint replacement surgery: Secondary | ICD-10-CM | POA: Diagnosis not present

## 2019-09-30 DIAGNOSIS — M1612 Unilateral primary osteoarthritis, left hip: Secondary | ICD-10-CM | POA: Diagnosis not present

## 2019-09-30 HISTORY — PX: TOTAL HIP ARTHROPLASTY: SHX124

## 2019-09-30 LAB — TYPE AND SCREEN
ABO/RH(D): O POS
Antibody Screen: NEGATIVE

## 2019-09-30 LAB — NO BLOOD PRODUCTS

## 2019-09-30 LAB — ABO/RH: ABO/RH(D): O POS

## 2019-09-30 SURGERY — ARTHROPLASTY, HIP, TOTAL, ANTERIOR APPROACH
Anesthesia: Spinal | Site: Hip | Laterality: Left

## 2019-09-30 MED ORDER — LACTATED RINGERS IV BOLUS: 500.0000 mL | Freq: Once | INTRAVENOUS | Status: DC

## 2019-09-30 MED ORDER — ONDANSETRON HCL 4 MG/2ML IJ SOLN
INTRAMUSCULAR | Status: AC
  Administered 2019-09-30: 18:00:00 4 mg via INTRAVENOUS
  Filled 2019-09-30: qty 2

## 2019-09-30 MED ORDER — GABAPENTIN 300 MG PO CAPS
300.0000 mg | ORAL_CAPSULE | Freq: Three times a day (TID) | ORAL | Status: DC
  Administered 2019-09-30 – 2019-10-01 (×2): 300 mg via ORAL
  Filled 2019-09-30 (×2): qty 1

## 2019-09-30 MED ORDER — ACETAMINOPHEN 500 MG PO TABS
1000.0000 mg | ORAL_TABLET | Freq: Four times a day (QID) | ORAL | Status: DC
  Administered 2019-09-30 – 2019-10-01 (×3): 1000 mg via ORAL
  Filled 2019-09-30 (×3): qty 2

## 2019-09-30 MED ORDER — LACTATED RINGERS IV SOLN: INTRAVENOUS | Status: DC

## 2019-09-30 MED ORDER — TRANEXAMIC ACID-NACL 1000-0.7 MG/100ML-% IV SOLN
1000.0000 mg | Freq: Once | INTRAVENOUS | Status: AC
  Administered 2019-09-30: 20:00:00 1000 mg via INTRAVENOUS
  Filled 2019-09-30: qty 100

## 2019-09-30 MED ORDER — BUPIVACAINE LIPOSOME 1.3 % IJ SUSP
INTRAMUSCULAR | Status: DC | PRN
  Administered 2019-09-30: 20 mL

## 2019-09-30 MED ORDER — DOCUSATE SODIUM 100 MG PO CAPS
100.0000 mg | ORAL_CAPSULE | Freq: Two times a day (BID) | ORAL | Status: DC
  Administered 2019-09-30 – 2019-10-01 (×2): 100 mg via ORAL
  Filled 2019-09-30 (×2): qty 1

## 2019-09-30 MED ORDER — SODIUM CHLORIDE 0.9 % IR SOLN
Status: DC | PRN
  Administered 2019-09-30: 3000 mL

## 2019-09-30 MED ORDER — HYDROMORPHONE HCL 1 MG/ML IJ SOLN
0.5000 mg | INTRAMUSCULAR | Status: DC | PRN
  Administered 2019-09-30: 1 mg via INTRAVENOUS
  Filled 2019-09-30 (×2): qty 1

## 2019-09-30 MED ORDER — HYDROXYZINE HCL 50 MG/ML IM SOLN: 50.0000 mg | Freq: Four times a day (QID) | INTRAMUSCULAR | Status: DC | PRN

## 2019-09-30 MED ORDER — ALUM & MAG HYDROXIDE-SIMETH 200-200-20 MG/5ML PO SUSP: 30.0000 mL | ORAL | Status: DC | PRN

## 2019-09-30 MED ORDER — ONDANSETRON HCL 4 MG PO TABS: 4.0000 mg | ORAL_TABLET | Freq: Four times a day (QID) | ORAL | Status: DC | PRN

## 2019-09-30 MED ORDER — EPHEDRINE SULFATE 50 MG/ML IJ SOLN
INTRAMUSCULAR | Status: DC | PRN
  Administered 2019-09-30 (×2): 5 mg via INTRAVENOUS

## 2019-09-30 MED ORDER — BUPIVACAINE IN DEXTROSE 0.75-8.25 % IT SOLN
INTRATHECAL | Status: DC | PRN
  Administered 2019-09-30: 2 mL via INTRATHECAL

## 2019-09-30 MED ORDER — VANCOMYCIN HCL 1 G IV SOLR
INTRAVENOUS | Status: DC | PRN
  Administered 2019-09-30: 1000 mg

## 2019-09-30 MED ORDER — METHOCARBAMOL 500 MG PO TABS
ORAL_TABLET | ORAL | Status: AC
  Filled 2019-09-30: qty 2

## 2019-09-30 MED ORDER — EPINEPHRINE PF 1 MG/ML IJ SOLN
INTRAMUSCULAR | Status: DC | PRN
  Administered 2019-09-30: .15 mL

## 2019-09-30 MED ORDER — ONDANSETRON HCL 4 MG/2ML IJ SOLN
INTRAMUSCULAR | Status: AC
  Filled 2019-09-30: qty 2

## 2019-09-30 MED ORDER — PROPOFOL 10 MG/ML IV BOLUS
INTRAVENOUS | Status: DC | PRN
  Administered 2019-09-30: 20 mg via INTRAVENOUS

## 2019-09-30 MED ORDER — DIPHENHYDRAMINE HCL 12.5 MG/5ML PO ELIX
25.0000 mg | ORAL_SOLUTION | ORAL | Status: DC | PRN
  Filled 2019-09-30: qty 10

## 2019-09-30 MED ORDER — LIDOCAINE HCL (CARDIAC) PF 100 MG/5ML IV SOSY
PREFILLED_SYRINGE | INTRAVENOUS | Status: DC | PRN
  Administered 2019-09-30: 100 mg via INTRAVENOUS

## 2019-09-30 MED ORDER — ONDANSETRON HCL 4 MG/2ML IJ SOLN
4.0000 mg | Freq: Four times a day (QID) | INTRAMUSCULAR | Status: DC | PRN
  Administered 2019-10-01: 4 mg via INTRAVENOUS
  Filled 2019-09-30: qty 2

## 2019-09-30 MED ORDER — OXYCODONE HCL ER 10 MG PO T12A
10.0000 mg | EXTENDED_RELEASE_TABLET | Freq: Two times a day (BID) | ORAL | Status: DC
  Administered 2019-09-30 – 2019-10-01 (×2): 10 mg via ORAL
  Filled 2019-09-30 (×2): qty 1

## 2019-09-30 MED ORDER — FENTANYL CITRATE (PF) 100 MCG/2ML IJ SOLN
INTRAMUSCULAR | Status: AC
  Administered 2019-09-30: 17:00:00 25 ug via INTRAVENOUS
  Filled 2019-09-30: qty 2

## 2019-09-30 MED ORDER — KETOROLAC TROMETHAMINE 15 MG/ML IJ SOLN
15.0000 mg | Freq: Four times a day (QID) | INTRAMUSCULAR | Status: AC
  Administered 2019-09-30 – 2019-10-01 (×4): 15 mg via INTRAVENOUS
  Filled 2019-09-30 (×3): qty 1

## 2019-09-30 MED ORDER — PHENYLEPHRINE HCL-NACL 10-0.9 MG/250ML-% IV SOLN
INTRAVENOUS | Status: DC | PRN
  Administered 2019-09-30: 40 ug/min via INTRAVENOUS

## 2019-09-30 MED ORDER — LIDOCAINE 2% (20 MG/ML) 5 ML SYRINGE
INTRAMUSCULAR | Status: AC
  Filled 2019-09-30: qty 5

## 2019-09-30 MED ORDER — PHENOL 1.4 % MT LIQD: 1.0000 | OROMUCOSAL | Status: DC | PRN

## 2019-09-30 MED ORDER — OXYCODONE HCL 5 MG PO TABS
5.0000 mg | ORAL_TABLET | ORAL | Status: DC | PRN
  Administered 2019-09-30: 10 mg via ORAL

## 2019-09-30 MED ORDER — LACTATED RINGERS IV BOLUS: 250.0000 mL | Freq: Once | INTRAVENOUS | Status: DC

## 2019-09-30 MED ORDER — SORBITOL 70 % SOLN
30.0000 mL | Freq: Every day | Status: DC | PRN
  Filled 2019-09-30: qty 30

## 2019-09-30 MED ORDER — CEFAZOLIN SODIUM-DEXTROSE 2-4 GM/100ML-% IV SOLN
2.0000 g | Freq: Four times a day (QID) | INTRAVENOUS | Status: AC
  Administered 2019-09-30 – 2019-10-01 (×3): 2 g via INTRAVENOUS
  Filled 2019-09-30 (×3): qty 100

## 2019-09-30 MED ORDER — KETOROLAC TROMETHAMINE 15 MG/ML IJ SOLN
INTRAMUSCULAR | Status: AC
  Filled 2019-09-30: qty 1

## 2019-09-30 MED ORDER — MAGNESIUM CITRATE PO SOLN: 1.0000 | Freq: Once | ORAL | Status: DC | PRN

## 2019-09-30 MED ORDER — SODIUM CHLORIDE 0.9 % IV SOLN: INTRAVENOUS | Status: DC

## 2019-09-30 MED ORDER — BUPIVACAINE HCL (PF) 0.5 % IJ SOLN
INTRAMUSCULAR | Status: AC
  Filled 2019-09-30: qty 30

## 2019-09-30 MED ORDER — POLYETHYLENE GLYCOL 3350 17 G PO PACK: 17.0000 g | PACK | Freq: Every day | ORAL | Status: DC | PRN

## 2019-09-30 MED ORDER — 0.9 % SODIUM CHLORIDE (POUR BTL) OPTIME
TOPICAL | Status: DC | PRN
  Administered 2019-09-30: 14:00:00 1000 mL

## 2019-09-30 MED ORDER — TRANEXAMIC ACID-NACL 1000-0.7 MG/100ML-% IV SOLN
INTRAVENOUS | Status: DC | PRN
  Administered 2019-09-30: 1000 mg via INTRAVENOUS

## 2019-09-30 MED ORDER — CEFAZOLIN SODIUM-DEXTROSE 2-3 GM-%(50ML) IV SOLR
INTRAVENOUS | Status: DC | PRN
  Administered 2019-09-30: 2 g via INTRAVENOUS

## 2019-09-30 MED ORDER — EPHEDRINE 5 MG/ML INJ
INTRAVENOUS | Status: AC
  Filled 2019-09-30: qty 10

## 2019-09-30 MED ORDER — PROPOFOL 500 MG/50ML IV EMUL
INTRAVENOUS | Status: DC | PRN
  Administered 2019-09-30: 100 ug/kg/min via INTRAVENOUS
  Administered 2019-09-30: 120 ug/kg/min via INTRAVENOUS
  Administered 2019-09-30: 115 ug/kg/min via INTRAVENOUS

## 2019-09-30 MED ORDER — METHOCARBAMOL 1000 MG/10ML IJ SOLN
500.0000 mg | Freq: Four times a day (QID) | INTRAVENOUS | Status: DC | PRN
  Filled 2019-09-30: qty 5

## 2019-09-30 MED ORDER — EPINEPHRINE PF 1 MG/ML IJ SOLN
INTRAMUSCULAR | Status: AC
  Filled 2019-09-30: qty 1

## 2019-09-30 MED ORDER — METOCLOPRAMIDE HCL 5 MG/ML IJ SOLN: 5.0000 mg | Freq: Three times a day (TID) | INTRAMUSCULAR | Status: DC | PRN

## 2019-09-30 MED ORDER — FENTANYL CITRATE (PF) 100 MCG/2ML IJ SOLN: 25.0000 ug | INTRAMUSCULAR | Status: DC | PRN

## 2019-09-30 MED ORDER — ONDANSETRON HCL 4 MG/2ML IJ SOLN: 4.0000 mg | Freq: Once | INTRAMUSCULAR | Status: AC | PRN

## 2019-09-30 MED ORDER — ASPIRIN 81 MG PO CHEW
81.0000 mg | CHEWABLE_TABLET | Freq: Two times a day (BID) | ORAL | Status: DC
  Administered 2019-09-30 – 2019-10-01 (×2): 81 mg via ORAL
  Filled 2019-09-30 (×2): qty 1

## 2019-09-30 MED ORDER — BUPIVACAINE HCL (PF) 0.5 % IJ SOLN
INTRAMUSCULAR | Status: DC | PRN
  Administered 2019-09-30: 20 mL

## 2019-09-30 MED ORDER — TRANEXAMIC ACID-NACL 1000-0.7 MG/100ML-% IV SOLN
INTRAVENOUS | Status: AC
  Filled 2019-09-30: qty 100

## 2019-09-30 MED ORDER — ACETAMINOPHEN 325 MG PO TABS: 325.0000 mg | ORAL_TABLET | Freq: Four times a day (QID) | ORAL | Status: DC | PRN

## 2019-09-30 MED ORDER — DEXAMETHASONE SODIUM PHOSPHATE 10 MG/ML IJ SOLN
10.0000 mg | Freq: Once | INTRAMUSCULAR | Status: AC
  Administered 2019-10-01: 10:00:00 10 mg via INTRAVENOUS
  Filled 2019-09-30: qty 1

## 2019-09-30 MED ORDER — CEFAZOLIN SODIUM-DEXTROSE 2-4 GM/100ML-% IV SOLN
INTRAVENOUS | Status: AC
  Filled 2019-09-30: qty 100

## 2019-09-30 MED ORDER — MENTHOL 3 MG MT LOZG: 1.0000 | LOZENGE | OROMUCOSAL | Status: DC | PRN

## 2019-09-30 MED ORDER — METHOCARBAMOL 750 MG PO TABS
750.0000 mg | ORAL_TABLET | Freq: Four times a day (QID) | ORAL | Status: DC | PRN
  Administered 2019-09-30: 16:00:00 750 mg via ORAL

## 2019-09-30 MED ORDER — OXYCODONE HCL 5 MG PO TABS: 10.0000 mg | ORAL_TABLET | ORAL | Status: DC | PRN

## 2019-09-30 MED ORDER — ONDANSETRON HCL 4 MG/2ML IJ SOLN
INTRAMUSCULAR | Status: DC | PRN
  Administered 2019-09-30: 4 mg via INTRAVENOUS

## 2019-09-30 MED ORDER — VANCOMYCIN HCL 1000 MG IV SOLR
INTRAVENOUS | Status: AC
  Filled 2019-09-30: qty 1000

## 2019-09-30 MED ORDER — METOCLOPRAMIDE HCL 5 MG PO TABS: 5.0000 mg | ORAL_TABLET | Freq: Three times a day (TID) | ORAL | Status: DC | PRN

## 2019-09-30 MED ORDER — TRANEXAMIC ACID 1000 MG/10ML IV SOLN
INTRAVENOUS | Status: DC | PRN
  Administered 2019-09-30: 2000 mg via TOPICAL

## 2019-09-30 MED ORDER — OXYCODONE HCL 5 MG PO TABS
ORAL_TABLET | ORAL | Status: AC
  Filled 2019-09-30: qty 2

## 2019-09-30 SURGICAL SUPPLY — 65 items
BAG DECANTER FOR FLEXI CONT (MISCELLANEOUS) ×2 IMPLANT
CELLS DAT CNTRL 66122 CELL SVR (MISCELLANEOUS) IMPLANT
COVER PERINEAL POST (MISCELLANEOUS) ×2 IMPLANT
COVER SURGICAL LIGHT HANDLE (MISCELLANEOUS) ×2 IMPLANT
COVER WAND RF STERILE (DRAPES) ×2 IMPLANT
CUP SECTOR GRIPTON 58MM (Orthopedic Implant) ×1 IMPLANT
DRAPE C-ARM 42X72 X-RAY (DRAPES) ×2 IMPLANT
DRAPE POUCH INSTRU U-SHP 10X18 (DRAPES) ×2 IMPLANT
DRAPE STERI IOBAN 125X83 (DRAPES) ×2 IMPLANT
DRAPE U-SHAPE 47X51 STRL (DRAPES) ×4 IMPLANT
DRESSING AQUACEL AG SP 3.5X10 (GAUZE/BANDAGES/DRESSINGS) IMPLANT
DRSG AQUACEL AG ADV 3.5X10 (GAUZE/BANDAGES/DRESSINGS) ×2 IMPLANT
DRSG AQUACEL AG SP 3.5X10 (GAUZE/BANDAGES/DRESSINGS) ×2
DURAPREP 26ML APPLICATOR (WOUND CARE) ×4 IMPLANT
ELECT BLADE 4.0 EZ CLEAN MEGAD (MISCELLANEOUS) ×2
ELECT REM PT RETURN 9FT ADLT (ELECTROSURGICAL) ×2
ELECTRODE BLDE 4.0 EZ CLN MEGD (MISCELLANEOUS) ×1 IMPLANT
ELECTRODE REM PT RTRN 9FT ADLT (ELECTROSURGICAL) ×1 IMPLANT
FEM STEM 12/14 TAPER SZ 4 HIP (Orthopedic Implant) ×2 IMPLANT
FEMORAL STEM 12/14 TPR SZ4 HIP (Orthopedic Implant) IMPLANT
GLOVE BIOGEL PI IND STRL 7.0 (GLOVE) ×1 IMPLANT
GLOVE BIOGEL PI INDICATOR 7.0 (GLOVE) ×1
GLOVE ECLIPSE 7.0 STRL STRAW (GLOVE) ×4 IMPLANT
GLOVE SKINSENSE NS SZ7.5 (GLOVE) ×1
GLOVE SKINSENSE STRL SZ7.5 (GLOVE) ×1 IMPLANT
GLOVE SURG SYN 7.5  E (GLOVE) ×4
GLOVE SURG SYN 7.5 E (GLOVE) ×4 IMPLANT
GLOVE SURG SYN 7.5 PF PI (GLOVE) ×4 IMPLANT
GOWN STRL REIN XL XLG (GOWN DISPOSABLE) ×2 IMPLANT
GOWN STRL REUS W/ TWL LRG LVL3 (GOWN DISPOSABLE) IMPLANT
GOWN STRL REUS W/ TWL XL LVL3 (GOWN DISPOSABLE) ×1 IMPLANT
GOWN STRL REUS W/TWL LRG LVL3 (GOWN DISPOSABLE)
GOWN STRL REUS W/TWL XL LVL3 (GOWN DISPOSABLE) ×2
HANDPIECE INTERPULSE COAX TIP (DISPOSABLE) ×2
HEAD M SROM 36MM PLUS 1.5 (Hips) ×1 IMPLANT
HOOD PEEL AWAY FLYTE STAYCOOL (MISCELLANEOUS) ×5 IMPLANT
IV NS IRRIG 3000ML ARTHROMATIC (IV SOLUTION) ×2 IMPLANT
JET LAVAGE IRRISEPT WOUND (IRRIGATION / IRRIGATOR) ×2
KIT BASIN OR (CUSTOM PROCEDURE TRAY) ×2 IMPLANT
LAVAGE JET IRRISEPT WOUND (IRRIGATION / IRRIGATOR) IMPLANT
LINER NEUTRAL 36X58 PLUS4 ×1 IMPLANT
MARKER SKIN DUAL TIP RULER LAB (MISCELLANEOUS) ×2 IMPLANT
NDL SPNL 18GX3.5 QUINCKE PK (NEEDLE) ×1 IMPLANT
NEEDLE SPNL 18GX3.5 QUINCKE PK (NEEDLE) ×2 IMPLANT
PACK TOTAL JOINT (CUSTOM PROCEDURE TRAY) ×2 IMPLANT
PACK UNIVERSAL I (CUSTOM PROCEDURE TRAY) ×2 IMPLANT
RETRACTOR WND ALEXIS 18 MED (MISCELLANEOUS) IMPLANT
RTRCTR WOUND ALEXIS 18CM MED (MISCELLANEOUS)
SAW OSC TIP CART 19.5X105X1.3 (SAW) ×2 IMPLANT
SET HNDPC FAN SPRY TIP SCT (DISPOSABLE) ×1 IMPLANT
SROM M HEAD 36MM PLUS 1.5 (Hips) ×2 IMPLANT
STAPLER VISISTAT 35W (STAPLE) IMPLANT
SUT ETHIBOND 2 V 37 (SUTURE) ×2 IMPLANT
SUT VIC AB 0 CT1 27 (SUTURE) ×2
SUT VIC AB 0 CT1 27XBRD ANBCTR (SUTURE) ×1 IMPLANT
SUT VIC AB 1 CTX 36 (SUTURE) ×2
SUT VIC AB 1 CTX36XBRD ANBCTR (SUTURE) ×1 IMPLANT
SUT VIC AB 2-0 CT1 27 (SUTURE) ×4
SUT VIC AB 2-0 CT1 TAPERPNT 27 (SUTURE) ×2 IMPLANT
SYR 50ML LL SCALE MARK (SYRINGE) ×2 IMPLANT
TOWEL GREEN STERILE (TOWEL DISPOSABLE) ×2 IMPLANT
TRAY CATH 16FR W/PLASTIC CATH (SET/KITS/TRAYS/PACK) IMPLANT
TRAY FOLEY W/BAG SLVR 16FR (SET/KITS/TRAYS/PACK)
TRAY FOLEY W/BAG SLVR 16FR ST (SET/KITS/TRAYS/PACK) IMPLANT
YANKAUER SUCT BULB TIP NO VENT (SUCTIONS) ×2 IMPLANT

## 2019-09-30 NOTE — Care Plan (Signed)
RNCM call to patient prior to surgery to discuss his upcoming Left THA with Dr. Roda Shutters on Monday, 09/30/19. CM was requested to assist with any discharge needs due to possible same day discharge from hospital on date of surgery. Patient verbalized he has a wife, who will be assisting at home. Anticipate HHPT will be needed. Choice provided and referral made to [facility]. Discussed DME and patient has a 3in1/BSC at home and has borrowed a FWW from a family member. No DME needed. F/U appointment scheduled for 10/15/19 at 10:15 am. Will continue to follow for CM needs.

## 2019-09-30 NOTE — Op Note (Signed)
LEFT TOTAL HIP ARTHROPLASTY ANTERIOR APPROACH  Procedure Note ZAHARI XIANG   [id number]  Pre-op Diagnosis: left hip degenerative joint disease     Post-op Diagnosis: same   Operative Procedures  1. Total hip replacement; Left hip; uncemented [id number]   Personnel  Surgeon(s): Tarry Kos, MD  Assist: Oneal Grout, PA-C; necessary for the timely completion of procedure and due to complexity of procedure.   Anesthesia: spinal  Prosthesis: Depuy Acetabulum: Pinnacle 58 mm Femur: Actis 4 HO Head: 36 mm size: +1.5 Liner: +4 neutral Bearing Type: metal on poly  Total Hip Arthroplasty (Anterior Approach) Op Note:  After informed consent was obtained and the operative extremity marked in the holding area, the patient was brought back to the operating room and placed supine on the HANA table. Next, the operative extremity was prepped and draped in normal sterile fashion. Surgical timeout occurred verifying patient identification, surgical site, surgical procedure and administration of antibiotics.  A modified anterior Smith-Peterson approach to the hip was performed, using the interval between tensor fascia lata and sartorius.  Dissection was carried bluntly down onto the anterior hip capsule. The lateral femoral circumflex vessels were identified and coagulated. A capsulotomy was performed and the capsular flaps tagged for later repair.  The neck osteotomy was performed. The femoral head was removed, the acetabular rim was cleared of soft tissue and attention was turned to reaming the acetabulum.  There were a few loose osteophytes that were removed. Sequential reaming was performed under fluoroscopic guidance. We reamed to a size 57 mm, and then impacted the acetabular shell. The liner was then placed after irrigation and attention turned to the femur.  After placing the femoral hook, the leg was taken to externally rotated, extended and adducted position taking care to perform  soft tissue releases to allow for adequate mobilization of the femur. Soft tissue was cleared from the shoulder of the greater trochanter and the hook elevator used to improve exposure of the proximal femur. Sequential broaching performed up to a size 4.  Of note, he had excellent cancellous bone. Trial neck and head were placed. The leg was brought back up to neutral and the construct reduced. The position and sizing of components, offset and leg lengths were checked using fluoroscopy. Stability of the construct was checked in extension and external rotation without any subluxation or impingement of prosthesis. We dislocated the prosthesis, dropped the leg back into position, removed trial components, and irrigated copiously. The final stem and head was then placed, the leg brought back up, the system reduced and fluoroscopy used to verify positioning.  We irrigated, obtained hemostasis and closed the capsule using #2 ethibond suture.  One gram of vancomycin powder was placed in the surgical bed. The fascia was closed with #1 vicryl plus, the deep fat layer was closed with 0 vicryl, the subcutaneous layers closed with 2.0 Vicryl Plus and the skin closed with 3.0 monocryl and steri strips. A sterile dressing was applied. The patient was awakened in the operating room and taken to recovery in stable condition.  All sponge, needle, and instrument counts were correct at the end of the case.   Position: supine  Complications: see description of procedure.  Time Out: performed   Drains/Packing: none  Estimated blood loss: see anesthesia record  Returned to Recovery Room: in good condition.   Antibiotics: yes   Mechanical VTE (DVT) Prophylaxis: sequential compression devices, TED thigh-high  Chemical VTE (DVT) Prophylaxis: aspirin   Fluid Replacement:  see anesthesia record  Specimens Removed: 1 to pathology   Sponge and Instrument Count Correct? yes   PACU: portable radiograph - low AP    Plan/RTC: Return in 2 weeks for staple removal. Weight Bearing/Load Lower Extremity: full  Hip precautions: none Suture Removal: 2 weeks   N. Eduard Roux, MD Marga Hoots 3:16 PM   Implant Name Type Inv. Item Serial No. Manufacturer Lot No. LRB No. Used Action  CUP SECTOR GRIPTON 58MM - [id number] Orthopedic Implant CUP SECTOR GRIPTON 58MM  DEPUY SYNTHES [id number] Left 1 Implanted  LINER NEUTRAL 57X36MM PLUS4 - [id number]  LINER NEUTRAL 57X36MM PLUS4  DEPUY SYNTHES [id number] Left 1 Implanted  FEM STEM 12/14 TAPER SZ 4 HIP - [id number] Orthopedic Implant FEM STEM 12/14 TAPER SZ 4 HIP  DEPUY ORTHOPAEDICS [id number] Left 1 Implanted  SROM M HEAD 36MM PLUS 1.5 - [id number] Hips SROM M HEAD 36MM PLUS 1.5  DEPUY SYNTHES [id number] Left 1 Implanted

## 2019-09-30 NOTE — Transfer of Care (Signed)
Immediate Anesthesia Transfer of Care Note  Patient: Gerald Whitaker  Procedure(s) Performed: LEFT TOTAL HIP ARTHROPLASTY ANTERIOR APPROACH (Left Hip)  Patient Location: PACU  Anesthesia Type:MAC and Spinal  Level of Consciousness: drowsy  Airway & Oxygen Therapy: Patient Spontanous Breathing and Patient connected to face mask oxygen  Post-op Assessment: Report given to RN and Post -op Vital signs reviewed and stable  Post vital signs: Reviewed and stable  Last Vitals:  Vitals Value Taken Time  BP 105/53 09/30/19 1554  Temp    Pulse 52 09/30/19 1556  Resp 8 09/30/19 1556  SpO2 100 % 09/30/19 1556  Vitals shown include unvalidated device data.  Last Pain:  Vitals:   09/30/19 1131  TempSrc:   PainSc: 0-No pain         Complications: No apparent anesthesia complications

## 2019-09-30 NOTE — Evaluation (Signed)
Physical Therapy Evaluation Patient Details Name: Gerald Whitaker MRN: [id number] DOB: 1945-11-27 Today's Date: 09/30/2019   History of Present Illness  Pt is a 74 y/o male s/p L THA, direct anterior approach. PMH includes arthritis.   Clinical Impression  Pt s/p surgery above with deficits below. Pt very unsteady with LLE instability noted. Required min to mod A for steadying using RW. Feel pt would benefit from continued PT prior to d/c; RN notified. Will continue to follow acutely to maximize functional mobility independence and safety.     Follow Up Recommendations Follow surgeon's recommendation for DC plan and follow-up therapies;Supervision for mobility/OOB    Equipment Recommendations  None recommended by PT    Recommendations for Other Services       Precautions / Restrictions Precautions Precautions: Fall Restrictions Weight Bearing Restrictions: Yes LLE Weight Bearing: Weight bearing as tolerated      Mobility  Bed Mobility Overal bed mobility: Needs Assistance Bed Mobility: Supine to Sit;Sit to Supine     Supine to sit: Mod assist Sit to supine: Mod assist   General bed mobility comments: Mod A for LLE and trunk assist during mobility. Required increased time to perform.   Transfers Overall transfer level: Needs assistance Equipment used: Rolling walker (2 wheeled) Transfers: Sit to/from Stand Sit to Stand: Min assist         General transfer comment: Min A for lift assist and steadying assist. Cues for safe hand placement.   Ambulation/Gait Ambulation/Gait assistance: Min assist;Mod assist Gait Distance (Feet): 1 Feet Assistive device: Rolling walker (2 wheeled) Gait Pattern/deviations: Step-to pattern;Decreased step length - right;Decreased step length - left;Decreased weight shift to left;Ataxic Gait velocity: Decreased   General Gait Details: Very unsteady gait. Instability noted in LLE and only able to take a couple of steps away from EOB. Min to  mod A for steadying. Pt reports some numbness in LLE.   Stairs            Wheelchair Mobility    Modified Rankin (Stroke Patients Only)       Balance Overall balance assessment: Needs assistance Sitting-balance support: No upper extremity supported;Feet supported Sitting balance-Leahy Scale: Fair     Standing balance support: Bilateral upper extremity supported;During functional activity Standing balance-Leahy Scale: Poor Standing balance comment: Reliant on UE and external support                              Pertinent Vitals/Pain Pain Assessment: Faces Faces Pain Scale: Hurts a little bit Pain Location: L hip  Pain Descriptors / Indicators: Aching;Operative site guarding Pain Intervention(s): Monitored during session;Limited activity within patient's tolerance;Repositioned    Home Living Family/patient expects to be discharged to:: Private residence Living Arrangements: Spouse/significant other;Children Available Help at Discharge: Family;Available 24 hours/day Type of Home: House Home Access: Ramped entrance     Home Layout: One level Home Equipment: Walker - 2 wheels;Cane - quad;Bedside commode      Prior Function Level of Independence: Independent with assistive device(s)         Comments: Used cane for ambulation      Hand Dominance        Extremity/Trunk Assessment   Upper Extremity Assessment Upper Extremity Assessment: Overall WFL for tasks assessed    Lower Extremity Assessment Lower Extremity Assessment: LLE deficits/detail LLE Deficits / Details: Pt reporting numbness in LLE. Deficits consistent with post op pain and weakness.     Cervical /  Trunk Assessment Cervical / Trunk Assessment: Kyphotic  Communication   Communication: No difficulties  Cognition Arousal/Alertness: Awake/alert Behavior During Therapy: WFL for tasks assessed/performed Overall Cognitive Status: Within Functional Limits for tasks assessed                                         General Comments General comments (skin integrity, edema, etc.): Pt with nausea; RN notified.     Exercises     Assessment/Plan    PT Assessment Patient needs continued PT services  PT Problem List Decreased strength;Decreased balance;Decreased activity tolerance;Decreased mobility;Decreased knowledge of use of DME;Pain;Impaired sensation       PT Treatment Interventions DME instruction;Gait training;Functional mobility training;Therapeutic exercise;Therapeutic activities;Balance training;Patient/family education    PT Goals (Current goals can be found in the Care Plan section)  Acute Rehab PT Goals Patient Stated Goal: For his leg to get stronger PT Goal Formulation: With patient Time For Goal Achievement: 10/14/19 Potential to Achieve Goals: Good    Frequency 7X/week   Barriers to discharge        Co-evaluation               AM-PAC PT "6 Clicks" Mobility  Outcome Measure Help needed turning from your back to your side while in a flat bed without using bedrails?: A Lot Help needed moving from lying on your back to sitting on the side of a flat bed without using bedrails?: A Lot Help needed moving to and from a bed to a chair (including a wheelchair)?: A Lot Help needed standing up from a chair using your arms (e.g., wheelchair or bedside chair)?: A Little Help needed to walk in hospital room?: A Lot Help needed climbing 3-5 steps with a railing? : A Lot 6 Click Score: 13    End of Session Equipment Utilized During Treatment: Gait belt Activity Tolerance: Patient tolerated treatment well Patient left: in bed;with call bell/phone within reach;with nursing/sitter in room Nurse Communication: Mobility status;Other (comment)(nausea) PT Visit Diagnosis: Muscle weakness (generalized) (M62.81);Unsteadiness on feet (R26.81)    Time: 8295-6213 PT Time Calculation (min) (ACUTE ONLY): 23 min   Charges:   PT  Evaluation $PT Eval Low Complexity: 1 Low PT Treatments $Therapeutic Activity: 8-22 mins        Gladys Damme, PT, DPT  Acute Rehabilitation Services  Pager: [phone] Office: [phone]   Lehman Prom 09/30/2019, 6:29 PM

## 2019-09-30 NOTE — Progress Notes (Signed)
Patient confirms that he choosed NOT to receive blood products for procedure today.  Blood bank notified, as T&S was ordered and sent on 09/26/19.  Refusal form faxed to blood bank at [phone] as requested.

## 2019-09-30 NOTE — Discharge Instructions (Addendum)

## 2019-09-30 NOTE — H&P (Signed)
PREOPERATIVE H&P  Chief Complaint: left hip degenerative joint disease  HPI: Gerald Whitaker is a 74 y.o. male who presents for surgical treatment of left hip degenerative joint disease.  He denies any changes in medical history.  Past Medical History:  Diagnosis Date  . Arthritis    No past surgical history on file. Social History   Socioeconomic History  . Marital status: Married    Spouse name: Not on file  . Number of children: Not on file  . Years of education: Not on file  . Highest education level: Not on file  Occupational History  . Not on file  Tobacco Use  . Smoking status: Former Smoker    Types: Cigarettes    Quit date: 03/1993    Years since quitting: 26.5  . Smokeless tobacco: Never Used  Substance and Sexual Activity  . Alcohol use: Not Currently    Comment: rarely  . Drug use: Never  . Sexual activity: Not on file  Other Topics Concern  . Not on file  Social History Narrative  . Not on file   Social Determinants of Health   Financial Resource Strain:   . Difficulty of Paying Living Expenses: Not on file  Food Insecurity:   . Worried About Charity fundraiser in the Last Year: Not on file  . Ran Out of Food in the Last Year: Not on file  Transportation Needs:   . Lack of Transportation (Medical): Not on file  . Lack of Transportation (Non-Medical): Not on file  Physical Activity:   . Days of Exercise per Week: Not on file  . Minutes of Exercise per Session: Not on file  Stress:   . Feeling of Stress : Not on file  Social Connections:   . Frequency of Communication with Friends and Family: Not on file  . Frequency of Social Gatherings with Friends and Family: Not on file  . Attends Religious Services: Not on file  . Active Member of Clubs or Organizations: Not on file  . Attends Archivist Meetings: Not on file  . Marital Status: Not on file   No family history on file. No Known Allergies Prior to Admission medications    Medication Sig Start Date End Date Taking? Authorizing Provider  MAGNESIUM PO Take 1 tablet by mouth daily.   Yes [provider]  Multiple Vitamin (MULTIVITAMIN WITH MINERALS) TABS tablet Take 1 tablet by mouth daily.   Yes [provider]  aspirin EC 81 MG tablet Take 1 tablet (81 mg total) by mouth 2 (two) times daily. 09/28/19   Leandrew Koyanagi, MD  methocarbamol (ROBAXIN) 750 MG tablet Take 1 tablet (750 mg total) by mouth 2 (two) times daily as needed for muscle spasms. 09/28/19   Leandrew Koyanagi, MD  ondansetron (ZOFRAN ODT) 8 MG disintegrating tablet Take 1 tablet (8 mg total) by mouth every 8 (eight) hours as needed for nausea or vomiting. Patient not taking: Reported on 08/23/2019 01/25/17   Lacretia Leigh, MD  ondansetron (ZOFRAN) 4 MG tablet Take 1-2 tablets (4-8 mg total) by mouth every 8 (eight) hours as needed for nausea or vomiting. 09/28/19   Leandrew Koyanagi, MD  oxyCODONE (OXYCONTIN) 10 mg 12 hr tablet Take 1 tablet (10 mg total) by mouth every 12 (twelve) hours for 3 days. 09/28/19 10/01/19  Leandrew Koyanagi, MD  oxyCODONE-acetaminophen (PERCOCET) 5-325 MG tablet Take 1-2 tablets by mouth every 8 (eight) hours as needed for  severe pain. 09/28/19   Tarry Kos, MD     Positive ROS: All other systems have been reviewed and were otherwise negative with the exception of those mentioned in the HPI and as above.  Physical Exam: General: Alert, no acute distress Cardiovascular: No pedal edema Respiratory: No cyanosis, no use of accessory musculature GI: abdomen soft Skin: No lesions in the area of chief complaint Neurologic: Sensation intact distally Psychiatric: Patient is competent for consent with normal mood and affect Lymphatic: no lymphedema  MUSCULOSKELETAL: exam stable  Assessment: left hip degenerative joint disease  Plan: Plan for Procedure(s): LEFT TOTAL HIP ARTHROPLASTY ANTERIOR APPROACH  The risks benefits and alternatives were discussed with the  patient including but not limited to the risks of nonoperative treatment, versus surgical intervention including infection, bleeding, nerve injury,  blood clots, cardiopulmonary complications, morbidity, mortality, among others, and they were willing to proceed.   Preoperative templating of the joint replacement has been completed, documented, and submitted to the Operating Room personnel in order to optimize intra-operative equipment management.  Glee Arvin, MD   09/30/2019 7:08 AM

## 2019-09-30 NOTE — Anesthesia Procedure Notes (Signed)
Spinal  Patient location during procedure: OR Start time: 09/30/2019 2:23 PM End time: 09/30/2019 2:28 PM Staffing Performed: anesthesiologist  Anesthesiologist: Marcene Duos, MD Preanesthetic Checklist Completed: patient identified, IV checked, site marked, risks and benefits discussed, surgical consent, monitors and equipment checked, pre-op evaluation and timeout performed Spinal Block Patient position: sitting Prep: DuraPrep Patient monitoring: heart rate, cardiac monitor, continuous pulse ox and blood pressure Approach: midline Location: L4-5 Injection technique: single-shot Needle Needle type: Pencan  Needle gauge: 24 G Needle length: 9 cm Assessment Sensory level: T4

## 2019-10-01 ENCOUNTER — Encounter: Payer: Self-pay | Admitting: *Deleted

## 2019-10-01 DIAGNOSIS — Z87891 Personal history of nicotine dependence: Secondary | ICD-10-CM | POA: Diagnosis not present

## 2019-10-01 DIAGNOSIS — Z7982 Long term (current) use of aspirin: Secondary | ICD-10-CM | POA: Diagnosis not present

## 2019-10-01 DIAGNOSIS — M1612 Unilateral primary osteoarthritis, left hip: Secondary | ICD-10-CM | POA: Diagnosis not present

## 2019-10-01 LAB — CBC
HCT: 36.6 % — ABNORMAL LOW (ref 39.0–52.0)
Hemoglobin: 12.2 g/dL — ABNORMAL LOW (ref 13.0–17.0)
MCH: 29.8 pg (ref 26.0–34.0)
MCHC: 33.3 g/dL (ref 30.0–36.0)
MCV: 89.5 fL (ref 80.0–100.0)
Platelets: 129 10*3/uL — ABNORMAL LOW (ref 150–400)
RBC: 4.09 MIL/uL — ABNORMAL LOW (ref 4.22–5.81)
RDW: 13.7 % (ref 11.5–15.5)
WBC: 5.8 10*3/uL (ref 4.0–10.5)
nRBC: 0 % (ref 0.0–0.2)

## 2019-10-01 LAB — BASIC METABOLIC PANEL
Anion gap: 11 (ref 5–15)
BUN: 9 mg/dL (ref 8–23)
CO2: 28 mmol/L (ref 22–32)
Calcium: 8.7 mg/dL — ABNORMAL LOW (ref 8.9–10.3)
Chloride: 102 mmol/L (ref 98–111)
Creatinine, Ser: 1.35 mg/dL — ABNORMAL HIGH (ref 0.61–1.24)
GFR calc Af Amer: 60 mL/min — ABNORMAL LOW (ref >60)
GFR calc non Af Amer: 51 mL/min — ABNORMAL LOW (ref >60)
Glucose, Bld: 93 mg/dL (ref 70–99)
Potassium: 3.8 mmol/L (ref 3.5–5.1)
Sodium: 141 mmol/L (ref 135–145)

## 2019-10-01 NOTE — Discharge Summary (Signed)
Patient ID: JERAD DUNLAP MRN: [id number] DOB/AGE: [birth year] 75 y.o.  Admit date: 09/30/2019 Discharge date: 10/01/2019  Admission Diagnoses:  Principal Problem:   Primary osteoarthritis of left hip Active Problems:   Status post total replacement of left hip   Discharge Diagnoses:  Same  Past Medical History:  Diagnosis Date  . Arthritis     Surgeries: Procedure(s): LEFT TOTAL HIP ARTHROPLASTY ANTERIOR APPROACH on 09/30/2019   Consultants:   Discharged Condition: Improved  Hospital Course: BROOX LONIGRO is an 74 y.o. male who was admitted 09/30/2019 for operative treatment ofPrimary osteoarthritis of left hip. Patient has severe unremitting pain that affects sleep, daily activities, and work/hobbies. After pre-op clearance the patient was taken to the operating room on 09/30/2019 and underwent  Procedure(s): LEFT TOTAL HIP ARTHROPLASTY ANTERIOR APPROACH.    Patient was given perioperative antibiotics:  Anti-infectives (From admission, onward)   Start     Dose/Rate Route Frequency Ordered Stop   09/30/19 2000  ceFAZolin (ANCEF) IVPB 2g/100 mL premix     2 g 200 mL/hr over 30 Minutes Intravenous Every 6 hours 09/30/19 1552 10/01/19 1359   09/30/19 1410  vancomycin (VANCOCIN) powder  Status:  Discontinued       As needed 09/30/19 1410 09/30/19 1549   09/30/19 1046  ceFAZolin (ANCEF) 2-4 GM/100ML-% IVPB    Note to Pharmacy: Carolynn Comment, Tammy   : cabinet override      09/30/19 1046 09/30/19 2259       Patient was given sequential compression devices, early ambulation, and chemoprophylaxis to prevent DVT.  Patient benefited maximally from hospital stay and there were no complications.    Recent vital signs:  Patient Vitals for the past 24 hrs:  BP Temp Temp src Pulse Resp SpO2 Height Weight  10/01/19 0743 (!) 109/52 98.2 F (36.8 C) Oral 73 16 97 % -- --  10/01/19 0410 (!) 99/58 98 F (36.7 C) Oral 64 18 97 % -- --  09/30/19 2251 (!) 143/74 98.2 F (36.8 C) Oral 66 20 99  % -- --  09/30/19 2025 (!) 156/90 98.3 F (36.8 C) Oral 78 20 100 % -- --  09/30/19 1847 (!) 145/92 97.6 F (36.4 C) Oral 83 20 97 % -- --  09/30/19 1815 136/83 (!) 97.3 F (36.3 C) -- -- 13 100 % -- --  09/30/19 1705 98/63 -- -- (!) 57 18 100 % -- --  09/30/19 1640 122/87 -- -- (!) 58 12 100 % -- --  09/30/19 1627 102/84 -- -- (!) 57 14 100 % -- --  09/30/19 1555 (!) 105/53 97.7 F (36.5 C) -- (!) 51 14 100 % -- --  09/30/19 1054 125/64 97.9 F (36.6 C) Oral 68 18 100 % 6' (1.829 m) 90.5 kg     Recent laboratory studies:  Recent Labs    10/01/19 0623  WBC 5.8  HGB 12.2*  HCT 36.6*  PLT 129*  NA 141  K 3.8  CL 102  CO2 28  BUN 9  CREATININE 1.35*  GLUCOSE 93  CALCIUM 8.7*     Discharge Medications:   Allergies as of 10/01/2019      Reactions   Other    NO BLOOD PRODUCTS      Medication List    STOP taking these medications   ondansetron 8 MG disintegrating tablet Commonly known as: Zofran ODT     TAKE these medications   aspirin EC 81 MG tablet Take 1 tablet (81 mg total) by  mouth 2 (two) times daily.   MAGNESIUM PO Take 1 tablet by mouth daily.   methocarbamol 750 MG tablet Commonly known as: ROBAXIN Take 1 tablet (750 mg total) by mouth 2 (two) times daily as needed for muscle spasms.   multivitamin with minerals Tabs tablet Take 1 tablet by mouth daily.   ondansetron 4 MG tablet Commonly known as: ZOFRAN Take 1-2 tablets (4-8 mg total) by mouth every 8 (eight) hours as needed for nausea or vomiting.   oxyCODONE 10 mg 12 hr tablet Commonly known as: OXYCONTIN Take 1 tablet (10 mg total) by mouth every 12 (twelve) hours for 3 days.   oxyCODONE-acetaminophen 5-325 MG tablet Commonly known as: Percocet Take 1-2 tablets by mouth every 8 (eight) hours as needed for severe pain.            Durable Medical Equipment  (From admission, onward)         Start     Ordered   09/30/19 1553  DME Walker rolling  Once    Question:  Patient needs  a walker to treat with the following condition  Answer:  History of hip replacement   09/30/19 1552   09/30/19 1553  DME 3 n 1  Once     09/30/19 1552   09/30/19 1553  DME Bedside commode  Once    Question:  Patient needs a bedside commode to treat with the following condition  Answer:  History of hip replacement   09/30/19 1552          Diagnostic Studies: DG Chest 2 View  Result Date: 09/26/2019 CLINICAL DATA:  74 year old male under preoperative evaluation prior to hip surgery. EXAM: CHEST - 2 VIEW COMPARISON:  Chest x-ray 09/26/2019. FINDINGS: Lung volumes are normal. No consolidative airspace disease. No pleural effusions. No pneumothorax. No pulmonary nodule or mass noted. Pulmonary vasculature and the cardiomediastinal silhouette are within normal limits. IMPRESSION: No radiographic evidence of acute cardiopulmonary disease. Electronically Signed   By: Trudie Reed M.D.   On: 09/26/2019 14:57   DG Pelvis Portable  Result Date: 09/30/2019 CLINICAL DATA:  Postop for left hip arthroplasty. EXAM: PORTABLE PELVIS 1-2 VIEWS COMPARISON:  Intraoperative films of earlier today. FINDINGS: 3:57 p.m. Left hip arthroplasty. No acute hardware complication. Moderate to marked right hip osteoarthritis. IMPRESSION: Expected appearance after left hip arthroplasty. Electronically Signed   By: Jeronimo Greaves M.D.   On: 09/30/2019 16:12   DG C-Arm 1-60 Min  Result Date: 09/30/2019 CLINICAL DATA:  LEFT anterior hip replacement EXAM: DG C-ARM 1-60 MIN; OPERATIVE LEFT HIP WITH PELVIS FLUOROSCOPY TIME:  Fluoroscopy Time:  0 minutes 21 seconds Radiation Exposure Index (if provided by the fluoroscopic device): 2.65 mGy Number of Acquired Spot Images: 8 COMPARISON:  08/23/2019 FINDINGS: Osseous demineralization. Degenerative changes of both hip joints. Images demonstrate placement of a LEFT hip prosthesis. No fracture or dislocation. IMPRESSION: Advanced osteoarthritic changes of both hip joints. LEFT hip  arthroplasty without acute complication. Electronically Signed   By: Ulyses Southward M.D.   On: 09/30/2019 15:19   DG HIP OPERATIVE UNILAT W OR W/O PELVIS LEFT  Result Date: 09/30/2019 CLINICAL DATA:  LEFT anterior hip replacement EXAM: DG C-ARM 1-60 MIN; OPERATIVE LEFT HIP WITH PELVIS FLUOROSCOPY TIME:  Fluoroscopy Time:  0 minutes 21 seconds Radiation Exposure Index (if provided by the fluoroscopic device): 2.65 mGy Number of Acquired Spot Images: 8 COMPARISON:  08/23/2019 FINDINGS: Osseous demineralization. Degenerative changes of both hip joints. Images demonstrate placement of a LEFT  hip prosthesis. No fracture or dislocation. IMPRESSION: Advanced osteoarthritic changes of both hip joints. LEFT hip arthroplasty without acute complication. Electronically Signed   By: Ulyses Southward M.D.   On: 09/30/2019 15:19    Disposition: Discharge disposition: 01-Home or Self Care         Follow-up Information    Tarry Kos, MD. Go on 10/15/2019.   Specialty: Orthopedic Surgery Why: At 10:15 am For suture removal, For wound re-check Contact information: [address] [place] [address] [phone]        Home, [facility] Follow up.   Specialty: Home Health Services Why: Your Dr. would like for you to have some home health Physical Therapy after discharge home; Someone from the agency will be in contact after discharge to arrange your first home health physical therapy visit. Contact information: [address] [place] [address] [phone]            Signed: Cristie Hem 10/01/2019, 8:09 AM

## 2019-10-01 NOTE — Progress Notes (Signed)
Physical Therapy Treatment & Discharge Patient Details Name: Gerald Whitaker MRN: [id number] DOB: 1945-12-05 Today's Date: 10/01/2019    History of Present Illness Pt is a 74 y/o male s/p L THA on 09/30/19, direct anterior approach. PMH includes arthritis.   PT Comments    Pt progressing well with mobility. Mod indep with transfers and ambulation using RW. Reviewed educ re: fall risk reduction, precautions, positioning, therex, importance of mobility and LE ROM. Pt has met short-term acute PT goals, has no further questions or concerns. Pt reports ready for d/c home; will have necessary assist from family. D/c acute PT.   Follow Up Recommendations  Follow surgeon's recommendation for DC plan and follow-up therapies;Home health PT     Equipment Recommendations  None recommended by PT    Recommendations for Other Services       Precautions / Restrictions Precautions Precautions: Fall Restrictions Weight Bearing Restrictions: Yes LLE Weight Bearing: Weight bearing as tolerated    Mobility  Bed Mobility Overal bed mobility: Modified Independent Bed Mobility: Supine to Sit;Sit to Supine     Supine to sit: Modified independent (Device/Increase time) Sit to supine: Modified independent (Device/Increase time)   General bed mobility comments: HOB slightly elevated  Transfers Overall transfer level: Modified independent Equipment used: None;Rolling walker (2 wheeled)             General transfer comment: Standing without DME. Educ on having RW close before standing to reduce fall risk  Ambulation/Gait Ambulation/Gait assistance: Modified independent (Device/Increase time) Gait Distance (Feet): 500 Feet Assistive device: Rolling walker (2 wheeled) Gait Pattern/deviations: Step-through pattern;Decreased stride length;Antalgic;Trunk flexed   Gait velocity interpretation: 1.31 - 2.62 ft/sec, indicative of limited community ambulator General Gait Details: Mod indep ambulation  with RW; cues to maintain upright posture   Stairs Stairs: (Pt declined, reports will use ramp)           Wheelchair Mobility    Modified Rankin (Stroke Patients Only)       Balance Overall balance assessment: Needs assistance   Sitting balance-Leahy Scale: Good       Standing balance-Leahy Scale: Fair Standing balance comment: Can static stand and take steps without UE support                            Cognition Arousal/Alertness: Awake/alert Behavior During Therapy: WFL for tasks assessed/performed Overall Cognitive Status: Within Functional Limits for tasks assessed                                        Exercises Other Exercises Other Exercises: Educ on seated LAQ and marching, supine hip/knee ROM, walking    General Comments        Pertinent Vitals/Pain Pain Assessment: Faces Faces Pain Scale: Hurts a little bit Pain Location: R hip > L hip Pain Descriptors / Indicators: Sore Pain Intervention(s): Monitored during session    Home Living                      Prior Function            PT Goals (current goals can now be found in the care plan section) Progress towards PT goals: Goals met/education completed, patient discharged from PT    Frequency    7X/week      PT Plan Current plan remains appropriate  Co-evaluation              AM-PAC PT "6 Clicks" Mobility   Outcome Measure  Help needed turning from your back to your side while in a flat bed without using bedrails?: None Help needed moving from lying on your back to sitting on the side of a flat bed without using bedrails?: None Help needed moving to and from a bed to a chair (including a wheelchair)?: None Help needed standing up from a chair using your arms (e.g., wheelchair or bedside chair)?: None Help needed to walk in hospital room?: None Help needed climbing 3-5 steps with a railing? : None 6 Click Score: 24    End of Session  Equipment Utilized During Treatment: Gait belt Activity Tolerance: Patient tolerated treatment well Patient left: in bed;with call bell/phone within reach Nurse Communication: Mobility status PT Visit Diagnosis: Muscle weakness (generalized) (M62.81);Unsteadiness on feet (R26.81)     Time: 0811-0829 PT Time Calculation (min) (ACUTE ONLY): 18 min  Charges:  $Therapeutic Exercise: 8-22 mins                    Mabeline Caras, PT, DPT Acute Rehabilitation Services  Pager [phone] Office [place] 10/01/2019, 8:46 AM

## 2019-10-01 NOTE — Plan of Care (Signed)
Pt given D/C instructions with verbal understanding. Rx's were sent to pharmacy by MD. Pt's incision is clean and dry with no sign of infection. Pt's IV was removed prior to D/C. Home Health PT was set up prior to D/C. Pt D/C'd home via wheelchair per MD order. Pt is stable @ D/C and has no other needs at this time. Rema Fendt, RN

## 2019-10-01 NOTE — Progress Notes (Signed)
Subjective: 1 Day Post-Op Procedure(s) (LRB): LEFT TOTAL HIP ARTHROPLASTY ANTERIOR APPROACH (Left) Patient reports pain as mild.  Doing well this am.   Objective: Vital signs in last 24 hours: Temp:  [97.3 F (36.3 C)-98.3 F (36.8 C)] 98.2 F (36.8 C) (01/19 0743) Pulse Rate:  [51-83] 73 (01/19 0743) Resp:  [12-20] 16 (01/19 0743) BP: (98-156)/(52-92) 109/52 (01/19 0743) SpO2:  [97 %-100 %] 97 % (01/19 0743) Weight:  [90.5 kg] 90.5 kg (01/18 1054)  Intake/Output from previous day: 01/18 0701 - 01/19 0700 In: 1650 [I.V.:1500; IV Piggyback:150] Out: 1775 [Urine:1375; Blood:400] Intake/Output this shift: Total I/O In: 240 [P.O.:240] Out: -   Recent Labs    10/01/19 0623  HGB 12.2*   Recent Labs    10/01/19 0623  WBC 5.8  RBC 4.09*  HCT 36.6*  PLT 129*   Recent Labs    10/01/19 0623  NA 141  K 3.8  CL 102  CO2 28  BUN 9  CREATININE 1.35*  GLUCOSE 93  CALCIUM 8.7*   No results for input(s): LABPT, INR in the last 72 hours.  Neurologically intact Neurovascular intact Sensation intact distally Intact pulses distally Dorsiflexion/Plantar flexion intact Incision: dressing C/D/I No cellulitis present Compartment soft   Assessment/Plan: 1 Day Post-Op Procedure(s) (LRB): LEFT TOTAL HIP ARTHROPLASTY ANTERIOR APPROACH (Left) Advance diet Up with therapy Discharge home with home health after first PT session ABLA- mild and stable      Cristie Hem 10/01/2019, 8:07 AM

## 2019-10-01 NOTE — Anesthesia Postprocedure Evaluation (Signed)
Anesthesia Post Note  Patient: Gerald Whitaker  Procedure(s) Performed: LEFT TOTAL HIP ARTHROPLASTY ANTERIOR APPROACH (Left Hip)     Patient location during evaluation: PACU Anesthesia Type: Spinal Level of consciousness: awake and alert Pain management: pain level controlled Vital Signs Assessment: post-procedure vital signs reviewed and stable Respiratory status: spontaneous breathing and respiratory function stable Cardiovascular status: blood pressure returned to baseline and stable Postop Assessment: spinal receding Anesthetic complications: no    Last Vitals:  Vitals:   09/30/19 2251 10/01/19 0410  BP: (!) 143/74 (!) 99/58  Pulse: 66 64  Resp: 20 18  Temp: 36.8 C 36.7 C  SpO2: 99% 97%    Last Pain:  Vitals:   10/01/19 0410  TempSrc: Oral  PainSc:                  Kennieth Rad

## 2019-10-02 ENCOUNTER — Telehealth: Payer: Self-pay | Admitting: Orthopaedic Surgery

## 2019-10-02 DIAGNOSIS — Z471 Aftercare following joint replacement surgery: Secondary | ICD-10-CM | POA: Diagnosis not present

## 2019-10-02 DIAGNOSIS — M1611 Unilateral primary osteoarthritis, right hip: Secondary | ICD-10-CM | POA: Diagnosis not present

## 2019-10-02 DIAGNOSIS — Z87891 Personal history of nicotine dependence: Secondary | ICD-10-CM | POA: Diagnosis not present

## 2019-10-02 DIAGNOSIS — Z96642 Presence of left artificial hip joint: Secondary | ICD-10-CM | POA: Diagnosis not present

## 2019-10-02 DIAGNOSIS — Z9181 History of falling: Secondary | ICD-10-CM | POA: Diagnosis not present

## 2019-10-02 DIAGNOSIS — Z7982 Long term (current) use of aspirin: Secondary | ICD-10-CM | POA: Diagnosis not present

## 2019-10-02 NOTE — Telephone Encounter (Signed)
Called to approve orders 

## 2019-10-02 NOTE — Telephone Encounter (Signed)
Received call from Gracee (PT) needing verbal orders for HHPT 2 Wk 1, 3 Wk 1 and 1 Wk 1. The number to contact Shelby Dubin is [phone]

## 2019-10-04 ENCOUNTER — Telehealth: Payer: Self-pay | Admitting: Orthopaedic Surgery

## 2019-10-04 ENCOUNTER — Other Ambulatory Visit: Payer: Self-pay | Admitting: Physician Assistant

## 2019-10-04 DIAGNOSIS — Z9181 History of falling: Secondary | ICD-10-CM | POA: Diagnosis not present

## 2019-10-04 DIAGNOSIS — M1611 Unilateral primary osteoarthritis, right hip: Secondary | ICD-10-CM | POA: Diagnosis not present

## 2019-10-04 DIAGNOSIS — Z87891 Personal history of nicotine dependence: Secondary | ICD-10-CM | POA: Diagnosis not present

## 2019-10-04 DIAGNOSIS — Z96642 Presence of left artificial hip joint: Secondary | ICD-10-CM | POA: Diagnosis not present

## 2019-10-04 DIAGNOSIS — Z471 Aftercare following joint replacement surgery: Secondary | ICD-10-CM | POA: Diagnosis not present

## 2019-10-04 DIAGNOSIS — Z7982 Long term (current) use of aspirin: Secondary | ICD-10-CM | POA: Diagnosis not present

## 2019-10-04 MED ORDER — DOCUSATE SODIUM 100 MG PO CAPS: 100.0000 mg | ORAL_CAPSULE | Freq: Every day | ORAL | 2 refills | Status: DC | PRN

## 2019-10-04 NOTE — Telephone Encounter (Signed)
Sent in colace

## 2019-10-04 NOTE — Telephone Encounter (Signed)
Called patient to let him know.

## 2019-10-04 NOTE — Telephone Encounter (Signed)
error 

## 2019-10-04 NOTE — Telephone Encounter (Signed)
Pt called in said he had surgery with dr. Roda Shutters 09-30-19 and is requesting a prescription for stool softner. Please have that sent to [facility] on [address]  [phone]

## 2019-10-07 DIAGNOSIS — Z96642 Presence of left artificial hip joint: Secondary | ICD-10-CM | POA: Diagnosis not present

## 2019-10-07 DIAGNOSIS — Z471 Aftercare following joint replacement surgery: Secondary | ICD-10-CM | POA: Diagnosis not present

## 2019-10-07 DIAGNOSIS — Z9181 History of falling: Secondary | ICD-10-CM | POA: Diagnosis not present

## 2019-10-07 DIAGNOSIS — M1611 Unilateral primary osteoarthritis, right hip: Secondary | ICD-10-CM | POA: Diagnosis not present

## 2019-10-07 DIAGNOSIS — Z7982 Long term (current) use of aspirin: Secondary | ICD-10-CM | POA: Diagnosis not present

## 2019-10-07 DIAGNOSIS — Z87891 Personal history of nicotine dependence: Secondary | ICD-10-CM | POA: Diagnosis not present

## 2019-10-08 ENCOUNTER — Telehealth: Payer: Self-pay

## 2019-10-08 NOTE — Telephone Encounter (Signed)
Patient called concerning replacement bandages for his left hip.  Would like to know where he could get the bandages from?  Patient had Left Total Hip on 09/30/2019. CB# [phone].  Please advise.  Thank you.

## 2019-10-08 NOTE — Telephone Encounter (Signed)
Has appt scheduled 10/15/2019. Do you want him to leave on until next appt or ?

## 2019-10-08 NOTE — Telephone Encounter (Signed)
Those bandages are from the hospital only.  Can't find them at pharmacy.  Is there a problem with the bandage?

## 2019-10-09 NOTE — Telephone Encounter (Signed)
Called patient to advise. He will leave on until next visit. No issues with bandage.

## 2019-10-10 DIAGNOSIS — M1611 Unilateral primary osteoarthritis, right hip: Secondary | ICD-10-CM | POA: Diagnosis not present

## 2019-10-10 DIAGNOSIS — Z7982 Long term (current) use of aspirin: Secondary | ICD-10-CM | POA: Diagnosis not present

## 2019-10-10 DIAGNOSIS — Z471 Aftercare following joint replacement surgery: Secondary | ICD-10-CM | POA: Diagnosis not present

## 2019-10-10 DIAGNOSIS — Z87891 Personal history of nicotine dependence: Secondary | ICD-10-CM | POA: Diagnosis not present

## 2019-10-10 DIAGNOSIS — Z96642 Presence of left artificial hip joint: Secondary | ICD-10-CM | POA: Diagnosis not present

## 2019-10-10 DIAGNOSIS — Z9181 History of falling: Secondary | ICD-10-CM | POA: Diagnosis not present

## 2019-10-11 DIAGNOSIS — Z87891 Personal history of nicotine dependence: Secondary | ICD-10-CM | POA: Diagnosis not present

## 2019-10-11 DIAGNOSIS — Z471 Aftercare following joint replacement surgery: Secondary | ICD-10-CM | POA: Diagnosis not present

## 2019-10-11 DIAGNOSIS — Z7982 Long term (current) use of aspirin: Secondary | ICD-10-CM | POA: Diagnosis not present

## 2019-10-11 DIAGNOSIS — Z9181 History of falling: Secondary | ICD-10-CM | POA: Diagnosis not present

## 2019-10-11 DIAGNOSIS — M1611 Unilateral primary osteoarthritis, right hip: Secondary | ICD-10-CM | POA: Diagnosis not present

## 2019-10-11 DIAGNOSIS — Z96642 Presence of left artificial hip joint: Secondary | ICD-10-CM | POA: Diagnosis not present

## 2019-10-14 ENCOUNTER — Encounter: Payer: Self-pay | Admitting: *Deleted

## 2019-10-14 DIAGNOSIS — Z96642 Presence of left artificial hip joint: Secondary | ICD-10-CM | POA: Diagnosis not present

## 2019-10-14 DIAGNOSIS — M1611 Unilateral primary osteoarthritis, right hip: Secondary | ICD-10-CM | POA: Diagnosis not present

## 2019-10-14 DIAGNOSIS — Z7982 Long term (current) use of aspirin: Secondary | ICD-10-CM | POA: Diagnosis not present

## 2019-10-14 DIAGNOSIS — Z471 Aftercare following joint replacement surgery: Secondary | ICD-10-CM | POA: Diagnosis not present

## 2019-10-14 DIAGNOSIS — Z87891 Personal history of nicotine dependence: Secondary | ICD-10-CM | POA: Diagnosis not present

## 2019-10-14 DIAGNOSIS — Z9181 History of falling: Secondary | ICD-10-CM | POA: Diagnosis not present

## 2019-10-15 ENCOUNTER — Other Ambulatory Visit: Payer: Self-pay

## 2019-10-15 ENCOUNTER — Ambulatory Visit (INDEPENDENT_AMBULATORY_CARE_PROVIDER_SITE_OTHER): Payer: Medicare HMO | Admitting: Orthopaedic Surgery

## 2019-10-15 ENCOUNTER — Encounter: Payer: Self-pay | Admitting: Orthopaedic Surgery

## 2019-10-15 DIAGNOSIS — Z96642 Presence of left artificial hip joint: Secondary | ICD-10-CM

## 2019-10-15 NOTE — Progress Notes (Signed)
   Post-Op Visit Note   Patient: Gerald Whitaker           Date of Birth: 02-Sep-1946           MRN: [id number] Visit Date: 10/15/2019 PCP: Gaspar Garbe, MD   Assessment & Plan:  Chief Complaint:  Chief Complaint  Patient presents with  . Left Hip - Pain   Visit Diagnoses:  1. Status post total replacement of left hip     Plan: Gerald Whitaker is 2-week status post left total hip placement.  He comes in today for his first postoperative visit.  He is doing well overall.  He is ambulating with a walker mainly for his right hip pain.  He has completed home health physical therapy and is walking with a cane.  He is very happy with his recovery and his pain improvement.  His surgical scar is healed no signs of infection.  Minimal swelling.  At this point we will continue to take aspirin for DVT prophylaxis.  I would like to recheck him in 4 weeks with standing AP pelvis and lateral hip.  We discussed that if he is progressing well then we could consider doing a right hip replacement.  Follow-Up Instructions: Return in about 4 weeks (around 11/12/2019).   Orders:  No orders of the defined types were placed in this encounter.  No orders of the defined types were placed in this encounter.   Imaging: No results found.  PMFS History: Patient Active Problem List   Diagnosis Date Noted  . Status post total replacement of left hip 09/30/2019  . Primary osteoarthritis of left hip 08/23/2019  . Primary osteoarthritis of right hip 08/23/2019   Past Medical History:  Diagnosis Date  . Arthritis     History reviewed. No pertinent family history.  Past Surgical History:  Procedure Laterality Date  . TOTAL HIP ARTHROPLASTY Left 09/30/2019   Procedure: LEFT TOTAL HIP ARTHROPLASTY ANTERIOR APPROACH;  Surgeon: Tarry Kos, MD;  Location: [facility] OR;  Service: Orthopedics;  Laterality: Left;   Social History   Occupational History  . Not on file  Tobacco Use  . Smoking status: Former Smoker    Types: Cigarettes    Quit date: 03/1993    Years since quitting: 26.6  . Smokeless tobacco: Never Used  Substance and Sexual Activity  . Alcohol use: Not Currently    Comment: rarely  . Drug use: Never  . Sexual activity: Not on file

## 2019-11-12 ENCOUNTER — Ambulatory Visit (INDEPENDENT_AMBULATORY_CARE_PROVIDER_SITE_OTHER): Payer: Medicare HMO

## 2019-11-12 ENCOUNTER — Other Ambulatory Visit: Payer: Self-pay

## 2019-11-12 ENCOUNTER — Ambulatory Visit: Payer: Medicare HMO | Admitting: Orthopaedic Surgery

## 2019-11-12 ENCOUNTER — Encounter: Payer: Self-pay | Admitting: Orthopaedic Surgery

## 2019-11-12 DIAGNOSIS — Z96642 Presence of left artificial hip joint: Secondary | ICD-10-CM

## 2019-11-12 NOTE — Progress Notes (Signed)
   Post-Op Visit Note   Patient: Gerald Whitaker           Date of Birth: [birth year]           MRN: [id number] Visit Date: 11/12/2019 PCP: Gaspar Garbe, MD   Assessment & Plan:  Chief Complaint:  Chief Complaint  Patient presents with  . Left Hip - Pain   Visit Diagnoses:  1. Status post total replacement of left hip     Plan: Mr. Reading is 6 weeks status post left total hip replacement.  He is doing very well and currently doing home exercises and is walking with a cane.  He only endorses pain in the right hip.  He has no real complaints other than some numbness in the thigh around the incision.  His surgical scar is healed without any signs of infection.  Painless range of motion of the left hip.  His x-rays are unremarkable.  At this point he has done very well and recovered fairly quickly.  He is eager to schedule his right total hip replacement which we will do today.  We will plan on 23-hour observation just like the last surgery.  Questions encouraged and answered.  Follow-Up Instructions: Return in about 4 weeks (around 12/10/2019).   Orders:  Orders Placed This Encounter  Procedures  . XR HIP UNILAT W OR W/O PELVIS 2-3 VIEWS LEFT   No orders of the defined types were placed in this encounter.   Imaging: XR HIP UNILAT W OR W/O PELVIS 2-3 VIEWS LEFT  Result Date: 11/12/2019 Stable total hip replacement without complication.  Severe right hip DJD.   PMFS History: Patient Active Problem List   Diagnosis Date Noted  . Status post total replacement of left hip 09/30/2019  . Primary osteoarthritis of left hip 08/23/2019  . Primary osteoarthritis of right hip 08/23/2019   Past Medical History:  Diagnosis Date  . Arthritis     History reviewed. No pertinent family history.  Past Surgical History:  Procedure Laterality Date  . TOTAL HIP ARTHROPLASTY Left 09/30/2019   Procedure: LEFT TOTAL HIP ARTHROPLASTY ANTERIOR APPROACH;  Surgeon: Tarry Kos, MD;  Location:  [facility] OR;  Service: Orthopedics;  Laterality: Left;   Social History   Occupational History  . Not on file  Tobacco Use  . Smoking status: Former Smoker    Types: Cigarettes    Quit date: 03/1993    Years since quitting: 26.6  . Smokeless tobacco: Never Used  Substance and Sexual Activity  . Alcohol use: Not Currently    Comment: rarely  . Drug use: Never  . Sexual activity: Not on file

## 2019-11-13 ENCOUNTER — Other Ambulatory Visit: Payer: Self-pay

## 2019-11-27 NOTE — Pre-Procedure Instructions (Addendum)
[facility] - [place], [place] - [address] [facility] [place] [address] Phone: [phone] Fax: [phone]  [id number] [place], [place] - [address] [place] [address] Phone: [phone] Fax: [phone]      Your procedure is scheduled on Monday, March 22nd.  Report to [facility] Main Entrance "A" at 5:30 A.M., and check in at the Admitting office.  Call this number if you have problems the morning of surgery:  [phone]  Call [phone] if you have any questions prior to your surgery date Monday-Friday 8am-4pm    Remember:  Do not eat or drink after midnight the night before your surgery  You may drink clear liquids until 4:30 A.M. the morning of your surgery.   Clear liquids allowed are: Water, Non-Citrus Juices (without pulp), Carbonated Beverages, Clear Tea, Black Coffee Only, and Gatorade.   Enhanced Recovery after Surgery for Orthopedics Enhanced Recovery after Surgery is a protocol used to improve the stress on your body and your recovery after surgery.  Patient Instructions  . The night before surgery:  o No food after midnight. ONLY clear liquids after midnight  .  Marland Kitchen The day of surgery (if you do NOT have diabetes):  o Drink ONE (1) Pre-Surgery Clear Ensure as directed.   o This drink was given to you during your hospital  pre-op appointment visit. o The pre-op nurse will instruct you on the time to drink the  Pre-Surgery Ensure depending on your surgery time. o Finish the drink at the designated time by the pre-op nurse.  o Nothing else to drink after completing the  Pre-Surgery Clear Ensure.    Please complete your PRE-SURGERY ENSURE that was provided to you by 4:30 A.M. the morning of surgery.  Please, if able, drink it in one setting. DO NOT SIP.      Take NO medications the morning of surgery.   As of today, STOP taking any  Aspirin (unless otherwise instructed by your surgeon), Aleve, Naproxen, Ibuprofen, Motrin, Advil, Goody's, BC's, all herbal medications, fish oil, and all vitamins.    The Morning of Surgery  Do not wear jewelry.  Do not wear lotions, powders, or colognes, or deodorant  Men may shave face and neck.  Do not bring valuables to the hospital.  [facility] is not responsible for any belongings or valuables.  If you are a smoker, DO NOT Smoke 24 hours prior to surgery  If you wear a CPAP at night please bring your mask the morning of surgery   Remember that you must have someone to transport you home after your surgery, and remain with you for 24 hours if you are discharged the same day.   Please bring cases for contacts, glasses, hearing aids, dentures or bridgework because it cannot be worn into surgery.    Leave your suitcase in the car.  After surgery it may be brought to your room.  For patients admitted to the hospital, discharge time will be determined by your treatment team.  Patients discharged the day of surgery will not be allowed to drive home.    Special instructions:   [place]- Preparing For Surgery  Before surgery, you can play an important role. Because skin is not sterile, your skin needs to be as free of germs as possible. You can reduce the number of germs on your skin by washing with CHG (chlorahexidine gluconate)  Soap before surgery.  CHG is an antiseptic cleaner which kills germs and bonds with the skin to continue killing germs even after washing.    Oral Hygiene is also important to reduce your risk of infection.  Remember - BRUSH YOUR TEETH THE MORNING OF SURGERY WITH YOUR REGULAR TOOTHPASTE  Please do not use if you have an allergy to CHG or antibacterial soaps. If your skin becomes reddened/irritated stop using the CHG.  Do not shave (including legs and underarms) for at least 48 hours prior to first CHG shower. It is OK to shave your face.  Please follow  these instructions carefully.   1. Shower the NIGHT BEFORE SURGERY and the MORNING OF SURGERY with CHG Soap.   2. If you chose to wash your hair, wash your hair first as usual with your normal shampoo.  3. After you shampoo, rinse your hair and body thoroughly to remove the shampoo.  4. Use CHG as you would any other liquid soap. You can apply CHG directly to the skin and wash gently with a scrungie or a clean washcloth.   5. Apply the CHG Soap to your body ONLY FROM THE NECK DOWN.  Do not use on open wounds or open sores. Avoid contact with your eyes, ears, mouth and genitals (private parts). Wash Face and genitals (private parts)  with your normal soap.   6. Wash thoroughly, paying special attention to the area where your surgery will be performed.  7. Thoroughly rinse your body with warm water from the neck down.  8. DO NOT shower/wash with your normal soap after using and rinsing off the CHG Soap.  9. Pat yourself dry with a CLEAN TOWEL.  10. Wear CLEAN PAJAMAS to bed the night before surgery, wear comfortable clothes the morning of surgery  11. Place CLEAN SHEETS on your bed the night of your first shower and DO NOT SLEEP WITH PETS.    Day of Surgery:  Please shower the morning of surgery with the CHG soap Do not apply any deodorants/lotions. Please wear clean clothes to the hospital/surgery center.   Remember to brush your teeth WITH YOUR REGULAR TOOTHPASTE.   Please read over the following fact sheets that you were given.

## 2019-11-28 ENCOUNTER — Telehealth: Payer: Self-pay | Admitting: *Deleted

## 2019-11-28 ENCOUNTER — Encounter (HOSPITAL_COMMUNITY): Payer: Self-pay

## 2019-11-28 ENCOUNTER — Encounter (HOSPITAL_COMMUNITY)
Admission: RE | Admit: 2019-11-28 | Discharge: 2019-11-28 | Disposition: A | Discharge status: Home / Self Care | Payer: Medicare HMO | Source: Ambulatory Visit | Attending: Orthopaedic Surgery | Admitting: Orthopaedic Surgery

## 2019-11-28 ENCOUNTER — Other Ambulatory Visit: Payer: Self-pay

## 2019-11-28 ENCOUNTER — Other Ambulatory Visit (HOSPITAL_COMMUNITY)
Admission: RE | Admit: 2019-11-28 | Discharge: 2019-11-28 | Disposition: A | Discharge status: Home / Self Care | Payer: Medicare HMO | Source: Ambulatory Visit | Attending: Orthopaedic Surgery | Admitting: Orthopaedic Surgery

## 2019-11-28 ENCOUNTER — Other Ambulatory Visit: Payer: Self-pay | Admitting: Physician Assistant

## 2019-11-28 DIAGNOSIS — Z20822 Contact with and (suspected) exposure to covid-19: Secondary | ICD-10-CM | POA: Insufficient documentation

## 2019-11-28 DIAGNOSIS — Z01812 Encounter for preprocedural laboratory examination: Secondary | ICD-10-CM | POA: Diagnosis not present

## 2019-11-28 LAB — SURGICAL PCR SCREEN
MRSA, PCR: NEGATIVE
Staphylococcus aureus: NEGATIVE

## 2019-11-28 LAB — COMPREHENSIVE METABOLIC PANEL
ALT: 23 U/L (ref 0–44)
AST: 24 U/L (ref 15–41)
Albumin: 3.8 g/dL (ref 3.5–5.0)
Alkaline Phosphatase: 86 U/L (ref 38–126)
Anion gap: 9 (ref 5–15)
BUN: 10 mg/dL (ref 8–23)
CO2: 29 mmol/L (ref 22–32)
Calcium: 9.7 mg/dL (ref 8.9–10.3)
Chloride: 104 mmol/L (ref 98–111)
Creatinine, Ser: 1.2 mg/dL (ref 0.61–1.24)
GFR calc Af Amer: >60 mL/min (ref >60)
GFR calc non Af Amer: 59 mL/min — ABNORMAL LOW (ref >60)
Glucose, Bld: 85 mg/dL (ref 70–99)
Potassium: 3.7 mmol/L (ref 3.5–5.1)
Sodium: 142 mmol/L (ref 135–145)
Total Bilirubin: 0.9 mg/dL (ref 0.3–1.2)
Total Protein: 7.4 g/dL (ref 6.5–8.1)

## 2019-11-28 LAB — URINALYSIS, ROUTINE W REFLEX MICROSCOPIC
Bilirubin Urine: NEGATIVE
Glucose, UA: NEGATIVE mg/dL
Hgb urine dipstick: NEGATIVE
Ketones, ur: NEGATIVE mg/dL
Leukocytes,Ua: NEGATIVE
Nitrite: NEGATIVE
Protein, ur: NEGATIVE mg/dL
Specific Gravity, Urine: 1.008 (ref 1.005–1.030)
pH: 7 (ref 5.0–8.0)

## 2019-11-28 LAB — CBC WITH DIFFERENTIAL/PLATELET
Abs Immature Granulocytes: 0.01 10*3/uL (ref 0.00–0.07)
Basophils Absolute: 0 10*3/uL (ref 0.0–0.1)
Basophils Relative: 1 %
Eosinophils Absolute: 0.2 10*3/uL (ref 0.0–0.5)
Eosinophils Relative: 4 %
HCT: 43.8 % (ref 39.0–52.0)
Hemoglobin: 13.9 g/dL (ref 13.0–17.0)
Immature Granulocytes: 0 %
Lymphocytes Relative: 30 %
Lymphs Abs: 1.2 10*3/uL (ref 0.7–4.0)
MCH: 28.8 pg (ref 26.0–34.0)
MCHC: 31.7 g/dL (ref 30.0–36.0)
MCV: 90.7 fL (ref 80.0–100.0)
Monocytes Absolute: 0.4 10*3/uL (ref 0.1–1.0)
Monocytes Relative: 9 %
Neutro Abs: 2.2 10*3/uL (ref 1.7–7.7)
Neutrophils Relative %: 56 %
Platelets: 158 10*3/uL (ref 150–400)
RBC: 4.83 MIL/uL (ref 4.22–5.81)
RDW: 13.7 % (ref 11.5–15.5)
WBC: 4 10*3/uL (ref 4.0–10.5)
nRBC: 0 % (ref 0.0–0.2)

## 2019-11-28 LAB — SARS CORONAVIRUS 2 (TAT 6-24 HRS): SARS Coronavirus 2: NEGATIVE

## 2019-11-28 LAB — PROTIME-INR
INR: 1.1 (ref 0.8–1.2)
Prothrombin Time: 13.6 seconds (ref 11.4–15.2)

## 2019-11-28 LAB — APTT: aPTT: 30 seconds (ref 24–36)

## 2019-11-28 MED ORDER — ASPIRIN EC 81 MG PO TBEC: 81.0000 mg | DELAYED_RELEASE_TABLET | Freq: Two times a day (BID) | ORAL | 0 refills | Status: DC

## 2019-11-28 MED ORDER — OXYCODONE-ACETAMINOPHEN 5-325 MG PO TABS: 1.0000 | ORAL_TABLET | Freq: Three times a day (TID) | ORAL | 0 refills | Status: DC | PRN

## 2019-11-28 MED ORDER — METHOCARBAMOL 750 MG PO TABS: 750.0000 mg | ORAL_TABLET | Freq: Two times a day (BID) | ORAL | 3 refills | Status: DC | PRN

## 2019-11-28 MED ORDER — ONDANSETRON HCL 4 MG PO TABS: 4.0000 mg | ORAL_TABLET | Freq: Three times a day (TID) | ORAL | 0 refills | Status: DC | PRN

## 2019-11-28 MED ORDER — DOCUSATE SODIUM 100 MG PO CAPS: 100.0000 mg | ORAL_CAPSULE | Freq: Every day | ORAL | 2 refills | Status: DC | PRN

## 2019-11-28 NOTE — Telephone Encounter (Signed)
Ortho bundle pre-op call and survey completed. 

## 2019-11-28 NOTE — Progress Notes (Signed)
PCP:  Guerry Bruin, MD Cardiologist:  Denies  EKG:  11/28/19 CXR:  09/26/19 ECHO:  Denies Stress Test:  Denies Cardiac Cath:  Denies  Covid test 11/28/19  Patient denies shortness of breath, fever, cough, and chest pain at PAT appointment.  Patient verbalized understanding of instructions provided today at the PAT appointment.  Patient asked to review instructions at home and day of surgery.

## 2019-11-28 NOTE — Care Plan (Signed)
RNCM call to patient to discuss upcoming Right THA with Dr. Roda Shutters on Monday, 12/02/19. Patient is an Ortho bundle with [facility] and discussed this and reviewed all questions. Patient in agreement to participate in bundle with CM. Patient did have a Left THA done in January of this year, but was not part of the Ortho bundle at that time. Reviewed all pre- and post-op questions. Patient has a spouse that will be able to help at discharge. He has all DME needed. Anticipate HHPT after short hospital stay. Choice provided and referral made to [facility]. CM will continue to follow for all CM needs.

## 2019-11-29 ENCOUNTER — Telehealth: Payer: Self-pay

## 2019-11-29 MED ORDER — TRANEXAMIC ACID 1000 MG/10ML IV SOLN
2000.0000 mg | INTRAVENOUS | Status: DC
  Filled 2019-11-29 (×2): qty 20

## 2019-11-29 MED ORDER — BUPIVACAINE LIPOSOME 1.3 % IJ SUSP
20.0000 mL | Freq: Once | INTRAMUSCULAR | Status: DC
  Filled 2019-11-29: qty 20

## 2019-11-29 NOTE — Telephone Encounter (Signed)
Called patient to let him know Rx was called into pharmacy.  Would [facility] [address] [facility]. Please send these in. Thank you.

## 2019-12-01 MED ORDER — ONDANSETRON HCL 4 MG PO TABS: 4.0000 mg | ORAL_TABLET | Freq: Three times a day (TID) | ORAL | 0 refills | Status: DC | PRN

## 2019-12-01 MED ORDER — METHOCARBAMOL 750 MG PO TABS: 750.0000 mg | ORAL_TABLET | Freq: Two times a day (BID) | ORAL | 3 refills | Status: DC | PRN

## 2019-12-01 MED ORDER — DOCUSATE SODIUM 100 MG PO CAPS: 100.0000 mg | ORAL_CAPSULE | Freq: Every day | ORAL | 2 refills | Status: AC | PRN

## 2019-12-01 MED ORDER — OXYCODONE-ACETAMINOPHEN 5-325 MG PO TABS: 1.0000 | ORAL_TABLET | Freq: Three times a day (TID) | ORAL | 0 refills | Status: DC | PRN

## 2019-12-01 MED ORDER — ASPIRIN EC 81 MG PO TBEC: 81.0000 mg | DELAYED_RELEASE_TABLET | Freq: Two times a day (BID) | ORAL | 0 refills | Status: DC

## 2019-12-01 NOTE — Anesthesia Preprocedure Evaluation (Addendum)
Anesthesia Evaluation  Patient identified by MRN, date of birth, ID band Patient awake    Reviewed: Allergy & Precautions, NPO status , Patient's Chart, lab work & pertinent test results  History of Anesthesia Complications Negative for: history of anesthetic complications  Airway Mallampati: II  TM Distance: >3 FB Neck ROM: Full    Dental no notable dental hx. (+) Dental Advisory Given   Pulmonary former smoker,    Pulmonary exam normal        Cardiovascular negative cardio ROS Normal cardiovascular exam     Neuro/Psych negative neurological ROS     GI/Hepatic negative GI ROS, Neg liver ROS,   Endo/Other  negative endocrine ROS  Renal/GU negative Renal ROS     Musculoskeletal  (+) Arthritis ,   Abdominal   Peds  Hematology  (+) JEHOVAH'S WITNESS  Anesthesia Other Findings   Reproductive/Obstetrics                            Anesthesia Physical  Anesthesia Plan  ASA: II  Anesthesia Plan: Spinal   Post-op Pain Management:    Induction:   PONV Risk Score and Plan: 1 and Propofol infusion, Ondansetron and Treatment may vary due to age or medical condition  Airway Management Planned: Natural Airway and Simple Face Mask  Additional Equipment:   Intra-op Plan:   Post-operative Plan:   Informed Consent: I have reviewed the patients History and Physical, chart, labs and discussed the procedure including the risks, benefits and alternatives for the proposed anesthesia with the patient or authorized representative who has indicated his/her understanding and acceptance.     Dental advisory given  Plan Discussed with: Anesthesiologist and CRNA  Anesthesia Plan Comments: (Pt will accept albumin. No blood products otherwise.)       Anesthesia Quick Evaluation

## 2019-12-01 NOTE — Telephone Encounter (Signed)
done

## 2019-12-02 ENCOUNTER — Ambulatory Visit (HOSPITAL_COMMUNITY): Payer: Medicare HMO

## 2019-12-02 ENCOUNTER — Encounter (HOSPITAL_COMMUNITY): Payer: Self-pay | Admitting: Orthopaedic Surgery

## 2019-12-02 ENCOUNTER — Ambulatory Visit (HOSPITAL_COMMUNITY): Payer: Medicare HMO | Admitting: Certified Registered"

## 2019-12-02 ENCOUNTER — Observation Stay (HOSPITAL_COMMUNITY)
Admission: RE | Admit: 2019-12-02 | Discharge: 2019-12-03 | Disposition: A | Discharge status: Home / Self Care | Payer: Medicare HMO | Attending: Orthopaedic Surgery | Admitting: Orthopaedic Surgery

## 2019-12-02 ENCOUNTER — Ambulatory Visit (HOSPITAL_COMMUNITY): Payer: Medicare HMO | Admitting: Physician Assistant

## 2019-12-02 ENCOUNTER — Other Ambulatory Visit: Payer: Self-pay

## 2019-12-02 ENCOUNTER — Encounter (HOSPITAL_COMMUNITY)
Admission: RE | Disposition: A | Discharge status: Home / Self Care | Payer: Self-pay | Source: Home / Self Care | Attending: Orthopaedic Surgery

## 2019-12-02 ENCOUNTER — Observation Stay (HOSPITAL_COMMUNITY): Payer: Medicare HMO

## 2019-12-02 DIAGNOSIS — M1611 Unilateral primary osteoarthritis, right hip: Principal | ICD-10-CM | POA: Insufficient documentation

## 2019-12-02 DIAGNOSIS — Z471 Aftercare following joint replacement surgery: Secondary | ICD-10-CM | POA: Diagnosis not present

## 2019-12-02 DIAGNOSIS — Z87891 Personal history of nicotine dependence: Secondary | ICD-10-CM | POA: Diagnosis not present

## 2019-12-02 DIAGNOSIS — Z7982 Long term (current) use of aspirin: Secondary | ICD-10-CM | POA: Diagnosis not present

## 2019-12-02 DIAGNOSIS — Z96642 Presence of left artificial hip joint: Secondary | ICD-10-CM | POA: Insufficient documentation

## 2019-12-02 DIAGNOSIS — Z96641 Presence of right artificial hip joint: Secondary | ICD-10-CM | POA: Diagnosis not present

## 2019-12-02 DIAGNOSIS — Z419 Encounter for procedure for purposes other than remedying health state, unspecified: Secondary | ICD-10-CM

## 2019-12-02 DIAGNOSIS — Z96649 Presence of unspecified artificial hip joint: Secondary | ICD-10-CM

## 2019-12-02 HISTORY — PX: TOTAL HIP ARTHROPLASTY: SHX124

## 2019-12-02 LAB — NO BLOOD PRODUCTS

## 2019-12-02 SURGERY — ARTHROPLASTY, HIP, TOTAL, ANTERIOR APPROACH
Anesthesia: Spinal | Site: Hip | Laterality: Right

## 2019-12-02 MED ORDER — LACTATED RINGERS IV SOLN: INTRAVENOUS | Status: DC

## 2019-12-02 MED ORDER — CHLORHEXIDINE GLUCONATE 4 % EX LIQD: 60.0000 mL | Freq: Once | CUTANEOUS | Status: DC

## 2019-12-02 MED ORDER — DEXAMETHASONE SODIUM PHOSPHATE 10 MG/ML IJ SOLN
10.0000 mg | Freq: Once | INTRAMUSCULAR | Status: AC
  Administered 2019-12-03: 10 mg via INTRAVENOUS
  Filled 2019-12-02: qty 1

## 2019-12-02 MED ORDER — CEFAZOLIN SODIUM-DEXTROSE 2-4 GM/100ML-% IV SOLN
2.0000 g | Freq: Four times a day (QID) | INTRAVENOUS | Status: AC
  Administered 2019-12-02 – 2019-12-03 (×3): 2 g via INTRAVENOUS
  Filled 2019-12-02 (×3): qty 100

## 2019-12-02 MED ORDER — DOCUSATE SODIUM 100 MG PO CAPS
100.0000 mg | ORAL_CAPSULE | Freq: Two times a day (BID) | ORAL | Status: DC
  Administered 2019-12-02 – 2019-12-03 (×2): 100 mg via ORAL
  Filled 2019-12-02 (×2): qty 1

## 2019-12-02 MED ORDER — PHENYLEPHRINE 40 MCG/ML (10ML) SYRINGE FOR IV PUSH (FOR BLOOD PRESSURE SUPPORT)
PREFILLED_SYRINGE | INTRAVENOUS | Status: AC
  Filled 2019-12-02: qty 10

## 2019-12-02 MED ORDER — ACETAMINOPHEN 500 MG PO TABS
1000.0000 mg | ORAL_TABLET | Freq: Four times a day (QID) | ORAL | Status: AC
  Administered 2019-12-02 – 2019-12-03 (×3): 1000 mg via ORAL
  Filled 2019-12-02 (×4): qty 2

## 2019-12-02 MED ORDER — HYDROMORPHONE HCL 1 MG/ML IJ SOLN: 0.5000 mg | INTRAMUSCULAR | Status: DC | PRN

## 2019-12-02 MED ORDER — BUPIVACAINE HCL (PF) 0.25 % IJ SOLN
INTRAMUSCULAR | Status: AC
  Filled 2019-12-02: qty 30

## 2019-12-02 MED ORDER — SODIUM CHLORIDE 0.9% FLUSH
INTRAVENOUS | Status: DC | PRN
  Administered 2019-12-02: 20 mL

## 2019-12-02 MED ORDER — TRANEXAMIC ACID 1000 MG/10ML IV SOLN
INTRAVENOUS | Status: DC | PRN
  Administered 2019-12-02: 2000 mg via TOPICAL

## 2019-12-02 MED ORDER — PHENOL 1.4 % MT LIQD: 1.0000 | OROMUCOSAL | Status: DC | PRN

## 2019-12-02 MED ORDER — FENTANYL CITRATE (PF) 100 MCG/2ML IJ SOLN
25.0000 ug | INTRAMUSCULAR | Status: DC | PRN
  Administered 2019-12-02 (×2): 50 ug via INTRAVENOUS

## 2019-12-02 MED ORDER — PROPOFOL 500 MG/50ML IV EMUL
INTRAVENOUS | Status: DC | PRN
  Administered 2019-12-02: 50 ug/kg/min via INTRAVENOUS

## 2019-12-02 MED ORDER — FENTANYL CITRATE (PF) 100 MCG/2ML IJ SOLN
INTRAMUSCULAR | Status: DC | PRN
  Administered 2019-12-02: 50 ug via INTRAVENOUS
  Administered 2019-12-02: 25 ug via INTRAVENOUS

## 2019-12-02 MED ORDER — SODIUM CHLORIDE 0.9 % IR SOLN
Status: DC | PRN
  Administered 2019-12-02: 3000 mL

## 2019-12-02 MED ORDER — POLYETHYLENE GLYCOL 3350 17 G PO PACK: 17.0000 g | PACK | Freq: Every day | ORAL | Status: DC | PRN

## 2019-12-02 MED ORDER — POVIDONE-IODINE 10 % EX SWAB: 2.0000 "application " | Freq: Once | CUTANEOUS | Status: DC

## 2019-12-02 MED ORDER — GLYCOPYRROLATE 0.2 MG/ML IJ SOLN
INTRAMUSCULAR | Status: DC | PRN
  Administered 2019-12-02: .2 mg via INTRAVENOUS

## 2019-12-02 MED ORDER — FENTANYL CITRATE (PF) 250 MCG/5ML IJ SOLN
INTRAMUSCULAR | Status: AC
  Filled 2019-12-02: qty 5

## 2019-12-02 MED ORDER — ACETAMINOPHEN 325 MG PO TABS: 325.0000 mg | ORAL_TABLET | Freq: Four times a day (QID) | ORAL | Status: DC | PRN

## 2019-12-02 MED ORDER — KETOROLAC TROMETHAMINE 15 MG/ML IJ SOLN
15.0000 mg | Freq: Four times a day (QID) | INTRAMUSCULAR | Status: AC
  Administered 2019-12-02 – 2019-12-03 (×4): 15 mg via INTRAVENOUS
  Filled 2019-12-02 (×3): qty 1

## 2019-12-02 MED ORDER — ASPIRIN 81 MG PO CHEW
81.0000 mg | CHEWABLE_TABLET | Freq: Two times a day (BID) | ORAL | Status: DC
  Administered 2019-12-02 – 2019-12-03 (×2): 81 mg via ORAL
  Filled 2019-12-02 (×2): qty 1

## 2019-12-02 MED ORDER — FENTANYL CITRATE (PF) 100 MCG/2ML IJ SOLN
INTRAMUSCULAR | Status: AC
  Filled 2019-12-02: qty 2

## 2019-12-02 MED ORDER — CEFAZOLIN SODIUM-DEXTROSE 2-4 GM/100ML-% IV SOLN
2.0000 g | INTRAVENOUS | Status: DC
  Filled 2019-12-02: qty 100

## 2019-12-02 MED ORDER — METOCLOPRAMIDE HCL 5 MG/ML IJ SOLN: 5.0000 mg | Freq: Three times a day (TID) | INTRAMUSCULAR | Status: DC | PRN

## 2019-12-02 MED ORDER — OXYCODONE HCL 5 MG PO TABS: 10.0000 mg | ORAL_TABLET | ORAL | Status: DC | PRN

## 2019-12-02 MED ORDER — HYDROXYZINE HCL 50 MG/ML IM SOLN
50.0000 mg | Freq: Four times a day (QID) | INTRAMUSCULAR | Status: DC | PRN
  Administered 2019-12-02: 50 mg via INTRAMUSCULAR
  Filled 2019-12-02: qty 1

## 2019-12-02 MED ORDER — TRANEXAMIC ACID-NACL 1000-0.7 MG/100ML-% IV SOLN
1000.0000 mg | INTRAVENOUS | Status: AC
  Administered 2019-12-02: 1000 mg via INTRAVENOUS
  Filled 2019-12-02: qty 100

## 2019-12-02 MED ORDER — PROMETHAZINE HCL 25 MG/ML IJ SOLN
6.2500 mg | INTRAMUSCULAR | Status: AC | PRN
  Administered 2019-12-02 (×2): 6.25 mg via INTRAVENOUS

## 2019-12-02 MED ORDER — SODIUM CHLORIDE 0.9 % IV SOLN: INTRAVENOUS | Status: DC

## 2019-12-02 MED ORDER — METHOCARBAMOL 1000 MG/10ML IJ SOLN
500.0000 mg | Freq: Four times a day (QID) | INTRAVENOUS | Status: DC | PRN
  Filled 2019-12-02: qty 5

## 2019-12-02 MED ORDER — ALUM & MAG HYDROXIDE-SIMETH 200-200-20 MG/5ML PO SUSP: 30.0000 mL | ORAL | Status: DC | PRN

## 2019-12-02 MED ORDER — ACETAMINOPHEN 500 MG PO TABS
1000.0000 mg | ORAL_TABLET | Freq: Once | ORAL | Status: AC
  Administered 2019-12-02: 1000 mg via ORAL
  Filled 2019-12-02: qty 2

## 2019-12-02 MED ORDER — TRANEXAMIC ACID-NACL 1000-0.7 MG/100ML-% IV SOLN
1000.0000 mg | Freq: Once | INTRAVENOUS | Status: AC
  Administered 2019-12-02: 1000 mg via INTRAVENOUS
  Filled 2019-12-02 (×2): qty 100

## 2019-12-02 MED ORDER — 0.9 % SODIUM CHLORIDE (POUR BTL) OPTIME
TOPICAL | Status: DC | PRN
  Administered 2019-12-02: 1000 mL

## 2019-12-02 MED ORDER — ONDANSETRON HCL 4 MG/2ML IJ SOLN: 4.0000 mg | Freq: Four times a day (QID) | INTRAMUSCULAR | Status: DC | PRN

## 2019-12-02 MED ORDER — DIPHENHYDRAMINE HCL 12.5 MG/5ML PO ELIX
25.0000 mg | ORAL_SOLUTION | ORAL | Status: DC | PRN
  Filled 2019-12-02: qty 10

## 2019-12-02 MED ORDER — SODIUM CHLORIDE 0.9 % IV SOLN
INTRAVENOUS | Status: DC | PRN
  Administered 2019-12-02: 20 mL

## 2019-12-02 MED ORDER — LIDOCAINE 2% (20 MG/ML) 5 ML SYRINGE
INTRAMUSCULAR | Status: AC
  Filled 2019-12-02: qty 5

## 2019-12-02 MED ORDER — METOCLOPRAMIDE HCL 5 MG PO TABS: 5.0000 mg | ORAL_TABLET | Freq: Three times a day (TID) | ORAL | Status: DC | PRN

## 2019-12-02 MED ORDER — CEFAZOLIN SODIUM-DEXTROSE 2-3 GM-%(50ML) IV SOLR
INTRAVENOUS | Status: DC | PRN
  Administered 2019-12-02: 2 g via INTRAVENOUS

## 2019-12-02 MED ORDER — OXYCODONE HCL 5 MG PO TABS
ORAL_TABLET | ORAL | Status: AC
  Filled 2019-12-02: qty 2

## 2019-12-02 MED ORDER — SORBITOL 70 % SOLN
30.0000 mL | Freq: Every day | Status: DC | PRN
  Filled 2019-12-02: qty 30

## 2019-12-02 MED ORDER — FENTANYL CITRATE (PF) 100 MCG/2ML IJ SOLN
INTRAMUSCULAR | Status: AC
  Administered 2019-12-02: 50 ug via INTRAVENOUS
  Filled 2019-12-02: qty 2

## 2019-12-02 MED ORDER — MAGNESIUM CITRATE PO SOLN: 1.0000 | Freq: Once | ORAL | Status: DC | PRN

## 2019-12-02 MED ORDER — OXYCODONE HCL ER 10 MG PO T12A
10.0000 mg | EXTENDED_RELEASE_TABLET | Freq: Two times a day (BID) | ORAL | Status: DC
  Administered 2019-12-02 – 2019-12-03 (×3): 10 mg via ORAL
  Filled 2019-12-02 (×3): qty 1

## 2019-12-02 MED ORDER — CELECOXIB 200 MG PO CAPS
200.0000 mg | ORAL_CAPSULE | Freq: Once | ORAL | Status: AC
  Administered 2019-12-02: 200 mg via ORAL
  Filled 2019-12-02: qty 1

## 2019-12-02 MED ORDER — LIDOCAINE HCL (CARDIAC) PF 100 MG/5ML IV SOSY
PREFILLED_SYRINGE | INTRAVENOUS | Status: DC | PRN
  Administered 2019-12-02: 20 mg via INTRATRACHEAL

## 2019-12-02 MED ORDER — VANCOMYCIN HCL 1000 MG IV SOLR
INTRAVENOUS | Status: AC
  Filled 2019-12-02: qty 1000

## 2019-12-02 MED ORDER — GABAPENTIN 300 MG PO CAPS
300.0000 mg | ORAL_CAPSULE | Freq: Three times a day (TID) | ORAL | Status: DC
  Administered 2019-12-02 – 2019-12-03 (×3): 300 mg via ORAL
  Filled 2019-12-02 (×3): qty 1

## 2019-12-02 MED ORDER — BUPIVACAINE HCL 0.25 % IJ SOLN
INTRAMUSCULAR | Status: DC | PRN
  Administered 2019-12-02: 20 mL

## 2019-12-02 MED ORDER — METHOCARBAMOL 750 MG PO TABS
750.0000 mg | ORAL_TABLET | Freq: Four times a day (QID) | ORAL | Status: DC | PRN
  Administered 2019-12-02 (×2): 750 mg via ORAL
  Filled 2019-12-02 (×2): qty 1

## 2019-12-02 MED ORDER — PHENYLEPHRINE HCL (PRESSORS) 10 MG/ML IV SOLN
INTRAVENOUS | Status: DC | PRN
  Administered 2019-12-02 (×2): 40 ug via INTRAVENOUS
  Administered 2019-12-02: 80 ug via INTRAVENOUS

## 2019-12-02 MED ORDER — PROMETHAZINE HCL 25 MG/ML IJ SOLN
INTRAMUSCULAR | Status: AC
  Filled 2019-12-02: qty 1

## 2019-12-02 MED ORDER — PROPOFOL 10 MG/ML IV BOLUS
INTRAVENOUS | Status: AC
  Filled 2019-12-02: qty 20

## 2019-12-02 MED ORDER — KETOROLAC TROMETHAMINE 15 MG/ML IJ SOLN
INTRAMUSCULAR | Status: AC
  Filled 2019-12-02: qty 1

## 2019-12-02 MED ORDER — ONDANSETRON HCL 4 MG/2ML IJ SOLN
INTRAMUSCULAR | Status: DC | PRN
  Administered 2019-12-02: 4 mg via INTRAVENOUS

## 2019-12-02 MED ORDER — OXYCODONE HCL 5 MG PO TABS
5.0000 mg | ORAL_TABLET | ORAL | Status: DC | PRN
  Administered 2019-12-02 (×2): 10 mg via ORAL
  Filled 2019-12-02 (×2): qty 2

## 2019-12-02 MED ORDER — MENTHOL 3 MG MT LOZG: 1.0000 | LOZENGE | OROMUCOSAL | Status: DC | PRN

## 2019-12-02 MED ORDER — ONDANSETRON HCL 4 MG PO TABS: 4.0000 mg | ORAL_TABLET | Freq: Four times a day (QID) | ORAL | Status: DC | PRN

## 2019-12-02 MED ORDER — BUPIVACAINE HCL (PF) 0.75 % IJ SOLN
INTRAMUSCULAR | Status: DC | PRN
  Administered 2019-12-02: 1.6 mL

## 2019-12-02 SURGICAL SUPPLY — 56 items
BAG DECANTER FOR FLEXI CONT (MISCELLANEOUS) ×2 IMPLANT
COVER PERINEAL POST (MISCELLANEOUS) ×2 IMPLANT
COVER SURGICAL LIGHT HANDLE (MISCELLANEOUS) ×2 IMPLANT
COVER WAND RF STERILE (DRAPES) ×2 IMPLANT
CUP SECTOR GRIPTON 58MM (Orthopedic Implant) ×1 IMPLANT
DRAPE C-ARM 42X72 X-RAY (DRAPES) ×2 IMPLANT
DRAPE POUCH INSTRU U-SHP 10X18 (DRAPES) ×2 IMPLANT
DRAPE STERI IOBAN 125X83 (DRAPES) ×2 IMPLANT
DRAPE U-SHAPE 47X51 STRL (DRAPES) ×4 IMPLANT
DRSG AQUACEL AG ADV 3.5X10 (GAUZE/BANDAGES/DRESSINGS) ×2 IMPLANT
DURAPREP 26ML APPLICATOR (WOUND CARE) ×4 IMPLANT
ELECT BLADE 4.0 EZ CLEAN MEGAD (MISCELLANEOUS) ×2
ELECT REM PT RETURN 9FT ADLT (ELECTROSURGICAL) ×2
ELECTRODE BLDE 4.0 EZ CLN MEGD (MISCELLANEOUS) ×1 IMPLANT
ELECTRODE REM PT RTRN 9FT ADLT (ELECTROSURGICAL) ×1 IMPLANT
GLOVE BIOGEL PI IND STRL 7.0 (GLOVE) ×1 IMPLANT
GLOVE BIOGEL PI INDICATOR 7.0 (GLOVE) ×4
GLOVE ECLIPSE 7.0 STRL STRAW (GLOVE) ×4 IMPLANT
GLOVE SKINSENSE NS SZ7.5 (GLOVE) ×1
GLOVE SKINSENSE STRL SZ7.5 (GLOVE) ×1 IMPLANT
GLOVE SURG SYN 7.5  E (GLOVE) ×8
GLOVE SURG SYN 7.5 E (GLOVE) ×4 IMPLANT
GLOVE SURG SYN 7.5 PF PI (GLOVE) ×4 IMPLANT
GOWN STRL REIN XL XLG (GOWN DISPOSABLE) ×2 IMPLANT
GOWN STRL REUS W/ TWL LRG LVL3 (GOWN DISPOSABLE) IMPLANT
GOWN STRL REUS W/ TWL XL LVL3 (GOWN DISPOSABLE) ×1 IMPLANT
GOWN STRL REUS W/TWL LRG LVL3 (GOWN DISPOSABLE)
GOWN STRL REUS W/TWL XL LVL3 (GOWN DISPOSABLE) ×2
HANDPIECE INTERPULSE COAX TIP (DISPOSABLE) ×2
HEAD M SROM 36MM 2 (Hips) IMPLANT
HOOD PEEL AWAY FLYTE STAYCOOL (MISCELLANEOUS) ×4 IMPLANT
IV NS IRRIG 3000ML ARTHROMATIC (IV SOLUTION) ×2 IMPLANT
KIT BASIN OR (CUSTOM PROCEDURE TRAY) ×2 IMPLANT
LINER NEUTRAL 36X58 PLUS4 ×1 IMPLANT
MARKER SKIN DUAL TIP RULER LAB (MISCELLANEOUS) ×2 IMPLANT
NDL SPNL 18GX3.5 QUINCKE PK (NEEDLE) ×1 IMPLANT
NEEDLE SPNL 18GX3.5 QUINCKE PK (NEEDLE) ×2 IMPLANT
PACK TOTAL JOINT (CUSTOM PROCEDURE TRAY) ×2 IMPLANT
PACK UNIVERSAL I (CUSTOM PROCEDURE TRAY) ×2 IMPLANT
SAW OSC TIP CART 19.5X105X1.3 (SAW) ×2 IMPLANT
SET HNDPC FAN SPRY TIP SCT (DISPOSABLE) ×1 IMPLANT
SROM M HEAD 36MM 2 (Hips) ×2 IMPLANT
STEM FEMORAL SZ5 HIGH ACTIS (Stem) ×1 IMPLANT
SUT ETHIBOND 2 V 37 (SUTURE) ×2 IMPLANT
SUT VIC AB 0 CT1 27 (SUTURE) ×2
SUT VIC AB 0 CT1 27XBRD ANBCTR (SUTURE) ×1 IMPLANT
SUT VIC AB 1 CTX 36 (SUTURE) ×2
SUT VIC AB 1 CTX36XBRD ANBCTR (SUTURE) ×1 IMPLANT
SUT VIC AB 2-0 CT1 27 (SUTURE) ×4
SUT VIC AB 2-0 CT1 TAPERPNT 27 (SUTURE) ×2 IMPLANT
SYR 50ML LL SCALE MARK (SYRINGE) ×3 IMPLANT
TOWEL GREEN STERILE (TOWEL DISPOSABLE) ×2 IMPLANT
TRAY CATH 16FR W/PLASTIC CATH (SET/KITS/TRAYS/PACK) IMPLANT
TRAY FOLEY W/BAG SLVR 16FR (SET/KITS/TRAYS/PACK) ×2
TRAY FOLEY W/BAG SLVR 16FR ST (SET/KITS/TRAYS/PACK) ×1 IMPLANT
YANKAUER SUCT BULB TIP NO VENT (SUCTIONS) ×2 IMPLANT

## 2019-12-02 NOTE — Evaluation (Addendum)
Physical Therapy Evaluation Patient Details Name: Gerald Whitaker MRN: [id number] DOB: [birth year] Today's Date: 12/02/2019   History of Present Illness  Patient is a 74 y/o male who presents s/p Rt THA 12/02/19, direct anterior approach. PMH includes arthritis and Left THA 09/30/19.  Clinical Impression  Patient presents with pain, nausea and post surgical deficits s/p above surgery. Pt Mod I using SPC for ambulation as needed PTA. Had left hip replaced in January and did well postop. Today mobility limited due to nausea and feeling sick. Min A needed for bed mobility to manage RLE and supervision for standing. Able to take a few steps along side bed with use of RW and min guard assist. Instructed pt in there ex. Likely pt will progress well with mobility once nausea improves. Will follow acutely to maximize independence and mobility prior to return home.    Follow Up Recommendations Follow surgeon's recommendation for DC plan and follow-up therapies;Home health PT(already setup)    Equipment Recommendations  None recommended by PT    Recommendations for Other Services       Precautions / Restrictions Precautions Precautions: Fall Precaution Comments: direct anterior approach Restrictions Weight Bearing Restrictions: No      Mobility  Bed Mobility Overal bed mobility: Needs Assistance Bed Mobility: Supine to Sit;Sit to Supine     Supine to sit: Min assist;HOB elevated Sit to supine: Min assist;HOB elevated   General bed mobility comments: Assist to manage RLE with bed mobility. No dizziness, + nausea.  Transfers Overall transfer level: Needs assistance Equipment used: Rolling walker (2 wheeled) Transfers: Sit to/from Stand Sit to Stand: Supervision         General transfer comment: Supervision for safety. Stood from [employer]. Declined transfer to chair due to nausea.  Ambulation/Gait Ambulation/Gait assistance: Min guard Gait Distance (Feet): 4 Feet Assistive device:  Rolling walker (2 wheeled) Gait Pattern/deviations: Shuffle     General Gait Details: Able to side step along side bed with Min gaurd assist; limited by nausea.  Stairs            Wheelchair Mobility    Modified Rankin (Stroke Patients Only)       Balance Overall balance assessment: Needs assistance Sitting-balance support: Feet supported;No upper extremity supported Sitting balance-Leahy Scale: Good     Standing balance support: During functional activity Standing balance-Leahy Scale: Poor Standing balance comment: Requires UE support.                             Pertinent Vitals/Pain Pain Assessment: Faces Faces Pain Scale: Hurts even more Pain Location: back, right hip with movement Pain Descriptors / Indicators: Grimacing;Guarding;Sore;Aching Pain Intervention(s): Repositioned;Monitored during session    Home Living Family/patient expects to be discharged to:: Private residence Living Arrangements: Spouse/significant other Available Help at Discharge: Family;Available 24 hours/day Type of Home: House Home Access: Ramped entrance     Home Layout: One level;Laundry or work area in [employer] Equipment: Environmental consultant - 2 wheels;Cane - quad;Bedside commode      Prior Function Level of Independence: Independent with assistive device(s)         Comments: Used cane for ambulation      Hand Dominance        Extremity/Trunk Assessment   Upper Extremity Assessment Upper Extremity Assessment: Defer to OT evaluation    Lower Extremity Assessment Lower Extremity Assessment: RLE deficits/detail RLE Deficits / Details: Limited AROM secondary to post op; Ankle AROM  WFL, able to perform quad sets, difficuly with hip abd/adduction RLE Sensation: WNL       Communication   Communication: No difficulties  Cognition Arousal/Alertness: Awake/alert Behavior During Therapy: WFL for tasks assessed/performed Overall Cognitive Status: Within Functional  Limits for tasks assessed                                        General Comments      Exercises Total Joint Exercises Ankle Circles/Pumps: AROM;Both;10 reps;Supine Quad Sets: AROM;Both;10 reps;Supine Gluteal Sets: AROM;Both;10 reps;Supine   Assessment/Plan    PT Assessment Patient needs continued PT services  PT Problem List Decreased mobility;Decreased strength;Pain;Decreased activity tolerance;Decreased range of motion       PT Treatment Interventions Therapeutic activities;Gait training;Therapeutic exercise;Patient/family education;Balance training;Functional mobility training    PT Goals (Current goals can be found in the Care Plan section)  Acute Rehab PT Goals Patient Stated Goal: to get back to working without pain PT Goal Formulation: With patient Time For Goal Achievement: 12/16/19 Potential to Achieve Goals: Good    Frequency 7X/week   Barriers to discharge        Co-evaluation               AM-PAC PT "6 Clicks" Mobility  Outcome Measure Help needed turning from your back to your side while in a flat bed without using bedrails?: A Little Help needed moving from lying on your back to sitting on the side of a flat bed without using bedrails?: A Little Help needed moving to and from a bed to a chair (including a wheelchair)?: A Little Help needed standing up from a chair using your arms (e.g., wheelchair or bedside chair)?: None Help needed to walk in hospital room?: A Little Help needed climbing 3-5 steps with a railing? : A Little 6 Click Score: 19    End of Session Equipment Utilized During Treatment: Gait belt Activity Tolerance: Other (comment)(nausea) Patient left: in bed;with call bell/phone within reach Nurse Communication: Mobility status PT Visit Diagnosis: Pain Pain - Right/Left: Right Pain - part of body: Hip(back)    Time: 6160-7371 PT Time Calculation (min) (ACUTE ONLY): 23 min   Charges:   PT Evaluation $PT  Eval Moderate Complexity: 1 Mod PT Treatments $Therapeutic Activity: 8-22 mins        Marisa Severin, PT, DPT Acute Rehabilitation Services Pager [phone] Office [phone]      Marguarite Arbour A Sabra Heck 12/02/2019, 2:45 PM

## 2019-12-02 NOTE — H&P (Signed)
Gerald H&P  Chief Complaint: right hip degenerative joint disease  HPI: Gerald Whitaker is a 74 y.o. male who presents for surgical treatment of right hip degenerative joint disease.  He denies any changes in medical history.  Past Medical History:  Diagnosis Date  . Arthritis    Past Surgical History:  Procedure Laterality Date  . TOTAL HIP ARTHROPLASTY Left 09/30/2019   Procedure: LEFT TOTAL HIP ARTHROPLASTY ANTERIOR APPROACH;  Surgeon: Leandrew Koyanagi, MD;  Location: [place];  Service: Orthopedics;  Laterality: Left;   Social History   Socioeconomic History  . Marital status: Married    Spouse name: Not on file  . Number of children: Not on file  . Years of education: Not on file  . Highest education level: Not on file  Occupational History  . Not on file  Tobacco Use  . Smoking status: Former Smoker    Types: Cigarettes    Quit date: 03/16/1983    Years since quitting: 36.7  . Smokeless tobacco: Never Used  Substance and Sexual Activity  . Alcohol use: Not Currently    Comment: rarely  . Drug use: Never  . Sexual activity: Not on file  Other Topics Concern  . Not on file  Social History Narrative  . Not on file   Social Determinants of Health   Financial Resource Strain:   . Difficulty of Paying Living Expenses:   Food Insecurity:   . Worried About Charity fundraiser in the Last Year:   . Arboriculturist in the Last Year:   Transportation Needs:   . Film/video editor (Medical):   Marland Kitchen Lack of Transportation (Non-Medical):   Physical Activity:   . Days of Exercise per Week:   . Minutes of Exercise per Session:   Stress:   . Feeling of Stress :   Social Connections:   . Frequency of Communication with Friends and Family:   . Frequency of Social Gatherings with Friends and Family:   . Attends Religious Services:   . Active Member of Clubs or Organizations:   . Attends Archivist Meetings:   Marland Kitchen Marital Status:    History reviewed. No  pertinent family history. Allergies  Allergen Reactions  . Other     NO BLOOD PRODUCTS   Prior to Admission medications   Medication Sig Start Date End Date Taking? Authorizing Provider  aspirin EC 81 MG tablet Take 1 tablet (81 mg total) by mouth 2 (two) times daily. 12/01/19  Yes Leandrew Koyanagi, MD  MAGNESIUM PO Take 1 tablet by mouth daily.   Yes [provider]  Multiple Vitamin (MULTIVITAMIN WITH MINERALS) TABS tablet Take 1 tablet by mouth daily.   Yes [provider]  ondansetron (ZOFRAN) 4 MG tablet Take 1-2 tablets (4-8 mg total) by mouth every 8 (eight) hours as needed for nausea or vomiting. 12/01/19  Yes Leandrew Koyanagi, MD  oxyCODONE-acetaminophen (PERCOCET) 5-325 MG tablet Take 1-2 tablets by mouth every 8 (eight) hours as needed for severe pain. 12/01/19  Yes Leandrew Koyanagi, MD  docusate sodium (COLACE) 100 MG capsule Take 1 capsule (100 mg total) by mouth daily as needed. 12/01/19 11/30/20  Leandrew Koyanagi, MD  methocarbamol (ROBAXIN) 750 MG tablet Take 1 tablet (750 mg total) by mouth 2 (two) times daily as needed for muscle spasms. 12/01/19   Leandrew Koyanagi, MD     Positive ROS: All other systems have been reviewed and  were otherwise negative with the exception of those mentioned in the HPI and as above.  Physical Exam: General: Alert, no acute distress Cardiovascular: No pedal edema Respiratory: No cyanosis, no use of accessory musculature GI: abdomen soft Skin: No lesions in the area of chief complaint Neurologic: Sensation intact distally Psychiatric: Patient is competent for consent with normal mood and affect Lymphatic: no lymphedema  MUSCULOSKELETAL: exam stable  Assessment: right hip degenerative joint disease  Plan: Plan for Procedure(s): RIGHT TOTAL HIP ARTHROPLASTY ANTERIOR APPROACH  The risks benefits and alternatives were discussed with the patient including but not limited to the risks of nonoperative treatment, versus surgical intervention  including infection, bleeding, nerve injury,  blood clots, cardiopulmonary complications, morbidity, mortality, among others, and they were willing to proceed.   Gerald templating of the joint replacement has been completed, documented, and submitted to the Operating Room personnel in order to optimize intra-operative equipment management.  Glee Arvin, MD   12/02/2019 7:02 AM

## 2019-12-02 NOTE — Anesthesia Procedure Notes (Signed)
Spinal  Patient location during procedure: OR Start time: 12/02/2019 7:14 AM End time: 12/02/2019 9:24 AM Staffing Performed: anesthesiologist  Anesthesiologist: Heather Roberts, MD Preanesthetic Checklist Completed: patient identified, IV checked, risks and benefits discussed, surgical consent, monitors and equipment checked, pre-op evaluation and timeout performed Spinal Block Patient position: sitting Prep: DuraPrep Patient monitoring: cardiac monitor, continuous pulse ox and blood pressure Approach: midline Location: L2-3 Injection technique: single-shot Needle Needle type: Pencan  Needle gauge: 24 G Needle length: 9 cm Additional Notes Functioning IV was confirmed and monitors were applied. Sterile prep and drape, including hand hygiene and sterile gloves were used. The patient was positioned and the spine was prepped. The skin was anesthetized with lidocaine.  Free flow of clear CSF was obtained prior to injecting local anesthetic into the CSF.  The spinal needle aspirated freely following injection.  The needle was carefully withdrawn.  The patient tolerated the procedure well.

## 2019-12-02 NOTE — Op Note (Signed)
RIGHT TOTAL HIP ARTHROPLASTY ANTERIOR APPROACH  Procedure Note Gerald Whitaker   [id number]  Pre-op Diagnosis: right hip degenerative joint disease     Post-op Diagnosis: same   Operative Procedures  1. Total hip replacement; Right hip; uncemented [id number]   Personnel  Surgeon(s): Leandrew Koyanagi, MD  Assist: Madalyn Rob, PA-C; necessary for the timely completion of procedure and due to complexity of procedure.   Anesthesia: spinal  Prosthesis: Depuy Acetabulum: Pinnacle 58 mm Femur: Actis 5 HO Head: 36 mm size: -2 Liner: +4 neutral Bearing Type: metal on poly  Total Hip Arthroplasty (Anterior Approach) Op Note:  After informed consent was obtained and the operative extremity marked in the holding area, the patient was brought back to the operating room and placed supine on the HANA table. Next, the operative extremity was prepped and draped in normal sterile fashion. Surgical timeout occurred verifying patient identification, surgical site, surgical procedure and administration of antibiotics.  A modified anterior Smith-Peterson approach to the hip was performed, using the interval between tensor fascia lata and sartorius.  Dissection was carried bluntly down onto the anterior hip capsule. The lateral femoral circumflex vessels were identified and coagulated. A capsulotomy was performed and the capsular flaps tagged for later repair.  The neck osteotomy was performed. The femoral head was removed, the acetabular rim was cleared of soft tissue and attention was turned to reaming the acetabulum.  Sequential reaming was performed under fluoroscopic guidance. We reamed to a size 57 mm, and then impacted the acetabular shell. The liner was then placed after irrigation and attention turned to the femur.  After placing the femoral hook, the leg was taken to externally rotated, extended and adducted position taking care to perform soft tissue releases to allow for adequate  mobilization of the femur. Soft tissue was cleared from the shoulder of the greater trochanter and the hook elevator used to improve exposure of the proximal femur. Sequential broaching performed up to a size 5. Trial neck and head were placed. The leg was brought back up to neutral and the construct reduced. The position and sizing of components, offset and leg lengths were checked using fluoroscopy. Stability of the construct was checked in extension and external rotation without any subluxation or impingement of prosthesis. We dislocated the prosthesis, dropped the leg back into position, removed trial components, and irrigated copiously. The final stem and head was then placed, the leg brought back up, the system reduced and fluoroscopy used to verify positioning.  We irrigated, obtained hemostasis and closed the capsule using #2 ethibond suture.  One gram of vancomycin powder was placed in the surgical bed. The fascia was closed with #1 vicryl plus, the deep fat layer was closed with 0 vicryl, the subcutaneous layers closed with 2.0 Vicryl Plus and the skin closed with 3.0 monocryl and steri strips. A sterile dressing was applied. The patient was awakened in the operating room and taken to recovery in stable condition.  All sponge, needle, and instrument counts were correct at the end of the case.   Position: supine  Complications: see description of procedure.  Time Out: performed   Drains/Packing: none  Estimated blood loss: see anesthesia record  Returned to Recovery Room: in good condition.   Antibiotics: yes   Mechanical VTE (DVT) Prophylaxis: sequential compression devices, TED thigh-high  Chemical VTE (DVT) Prophylaxis: aspirin   Fluid Replacement: see anesthesia record  Specimens Removed: 1 to pathology   Sponge and Instrument Count Correct? yes  PACU: portable radiograph - low AP   Plan/RTC: Return in 2 weeks for staple removal. Weight Bearing/Load Lower Extremity: full   Hip precautions: none Suture Removal: 2 weeks   N. Glee Arvin, MD [facility] 9:14 AM   Implant Name Type Inv. Item Serial No. Manufacturer Lot No. LRB No. Used Action  CUP SECTOR GRIPTON - [id number] Orthopedic Implant CUP SECTOR GRIPTON  DEPUY SYNTHES [id number] Right 1 Implanted  LINER NEUTRAL 57X36MM PLUS4 - [id number]  LINER NEUTRAL 57X36MM PLUS4  DEPUY SYNTHES [id number] Right 1 Implanted  STEM FEMORAL SZ5 HIGH ACTIS - [id number] Stem STEM FEMORAL SZ5 HIGH ACTIS  DEPUY ORTHOPAEDICS [id number] Right 1 Implanted  SROM M HEAD 2 - [id number] Hips SROM M HEAD 2  DEPUY SYNTHES [id number] Right 1 Implanted

## 2019-12-02 NOTE — Anesthesia Procedure Notes (Signed)
Procedure Name: MAC Date/Time: 12/02/2019 7:20 AM Performed by: Lavell Luster, CRNA Pre-anesthesia Checklist: Patient identified, Emergency Drugs available, Suction available and Patient being monitored Patient Re-evaluated:Patient Re-evaluated prior to induction Oxygen Delivery Method: Simple face mask Preoxygenation: Pre-oxygenation with 100% oxygen Induction Type: IV induction Placement Confirmation: breath sounds checked- equal and bilateral and positive ETCO2 Dental Injury: Teeth and Oropharynx as per pre-operative assessment

## 2019-12-02 NOTE — Care Plan (Signed)
Ortho Bundle Case Management Note  Patient Details  Name: Gerald Whitaker MRN: [id number] Date of Birth: 05-Aug-1946   RNCM call to patient on 11/28/19 to discuss upcoming Right THA with Dr. Roda Shutters on Monday, 12/02/19. Patient is an Ortho bundle with THN/TOM and discussed this and reviewed all questions. Patient in agreement to participate in bundle with CM. Patient did have a Left THA done in January of this year, but was not part of the Ortho bundle at that time. Reviewed all pre- and post-op questions. Patient has a spouse that will be able to help at discharge. He has all DME needed. Anticipate HHPT after short hospital stay. Choice provided and referral made to [facility]. CM will continue to follow for all CM needs.         DME Arranged:  (Patient has all DME from previous surgery (FWW and BSC/3in1)) DME Agency:     HH Arranged:  PT HH Agency:  [facility] (now [facility])  Additional Comments: Please contact me with any questions of if this plan should need to change.  Ralph Dowdy, RN, BSN, [employer]  [phone] 12/02/2019, 10:04 AM

## 2019-12-02 NOTE — Transfer of Care (Signed)
Immediate Anesthesia Transfer of Care Note  Patient: Gerald Whitaker  Procedure(s) Performed: RIGHT TOTAL HIP ARTHROPLASTY ANTERIOR APPROACH (Right Hip)  Patient Location: PACU  Anesthesia Type:MAC  Level of Consciousness: awake, alert  and sedated  Airway & Oxygen Therapy: Patient connected to face mask oxygen  Post-op Assessment: Post -op Vital signs reviewed and stable  Post vital signs: stable  Last Vitals:  Vitals Value Taken Time  BP 107/63 12/02/19 0942  Temp    Pulse 51 12/02/19 0942  Resp 8 12/02/19 0942  SpO2 99 % 12/02/19 0942  Vitals shown include unvalidated device data.  Last Pain:  Vitals:   12/02/19 0610  TempSrc:   PainSc: 0-No pain         Complications: No apparent anesthesia complications

## 2019-12-02 NOTE — Anesthesia Postprocedure Evaluation (Signed)
Anesthesia Post Note  Patient: Gerald Whitaker  Procedure(s) Performed: RIGHT TOTAL HIP ARTHROPLASTY ANTERIOR APPROACH (Right Hip)     Patient location during evaluation: PACU Anesthesia Type: Spinal Level of consciousness: awake and alert Pain management: pain level controlled Vital Signs Assessment: post-procedure vital signs reviewed and stable Respiratory status: spontaneous breathing and respiratory function stable Cardiovascular status: blood pressure returned to baseline and stable Postop Assessment: spinal receding Anesthetic complications: no    Last Vitals:  Vitals:   12/02/19 1030 12/02/19 1045  BP: 140/73 (!) 145/73  Pulse: (!) 54 60  Resp: 12 12  Temp:    SpO2: 99% 100%                   Maleki Hippe DANIEL

## 2019-12-03 ENCOUNTER — Encounter: Payer: Self-pay | Admitting: *Deleted

## 2019-12-03 DIAGNOSIS — Z7982 Long term (current) use of aspirin: Secondary | ICD-10-CM | POA: Diagnosis not present

## 2019-12-03 DIAGNOSIS — M1611 Unilateral primary osteoarthritis, right hip: Secondary | ICD-10-CM | POA: Diagnosis not present

## 2019-12-03 DIAGNOSIS — Z87891 Personal history of nicotine dependence: Secondary | ICD-10-CM | POA: Diagnosis not present

## 2019-12-03 DIAGNOSIS — Z96642 Presence of left artificial hip joint: Secondary | ICD-10-CM | POA: Diagnosis not present

## 2019-12-03 LAB — BASIC METABOLIC PANEL
Anion gap: 7 (ref 5–15)
BUN: 10 mg/dL (ref 8–23)
CO2: 29 mmol/L (ref 22–32)
Calcium: 8.4 mg/dL — ABNORMAL LOW (ref 8.9–10.3)
Chloride: 104 mmol/L (ref 98–111)
Creatinine, Ser: 1.39 mg/dL — ABNORMAL HIGH (ref 0.61–1.24)
GFR calc Af Amer: 57 mL/min — ABNORMAL LOW (ref >60)
GFR calc non Af Amer: 50 mL/min — ABNORMAL LOW (ref >60)
Glucose, Bld: 105 mg/dL — ABNORMAL HIGH (ref 70–99)
Potassium: 4.4 mmol/L (ref 3.5–5.1)
Sodium: 140 mmol/L (ref 135–145)

## 2019-12-03 LAB — CBC
HCT: 34.5 % — ABNORMAL LOW (ref 39.0–52.0)
Hemoglobin: 11 g/dL — ABNORMAL LOW (ref 13.0–17.0)
MCH: 28.5 pg (ref 26.0–34.0)
MCHC: 31.9 g/dL (ref 30.0–36.0)
MCV: 89.4 fL (ref 80.0–100.0)
Platelets: 112 10*3/uL — ABNORMAL LOW (ref 150–400)
RBC: 3.86 MIL/uL — ABNORMAL LOW (ref 4.22–5.81)
RDW: 14.1 % (ref 11.5–15.5)
WBC: 6.6 10*3/uL (ref 4.0–10.5)
nRBC: 0 % (ref 0.0–0.2)

## 2019-12-03 NOTE — Discharge Summary (Signed)
Patient ID: Gerald Whitaker MRN: [id number] DOB/AGE: 1946-06-28 74 y.o.  Admit date: 12/02/2019 Discharge date: 12/03/2019  Admission Diagnoses:  Principal Problem:   Primary osteoarthritis of right hip Active Problems:   Status post total replacement of right hip   Discharge Diagnoses:  Same  Past Medical History:  Diagnosis Date  . Arthritis     Surgeries: Procedure(s): RIGHT TOTAL HIP ARTHROPLASTY ANTERIOR APPROACH on 12/02/2019   Consultants:   Discharged Condition: Improved  Hospital Course: Gerald Whitaker is an 74 y.o. male who was admitted 12/02/2019 for operative treatment ofPrimary osteoarthritis of right hip. Patient has severe unremitting pain that affects sleep, daily activities, and work/hobbies. After pre-op clearance the patient was taken to the operating room on 12/02/2019 and underwent  Procedure(s): RIGHT TOTAL HIP ARTHROPLASTY ANTERIOR APPROACH.    Patient was given perioperative antibiotics:  Anti-infectives (From admission, onward)   Start     Dose/Rate Route Frequency Ordered Stop   12/02/19 1400  ceFAZolin (ANCEF) IVPB 2g/100 mL premix     2 g 200 mL/hr over 30 Minutes Intravenous Every 6 hours 12/02/19 1138 12/03/19 0038   12/02/19 0600  ceFAZolin (ANCEF) IVPB 2g/100 mL premix  Status:  Discontinued     2 g 200 mL/hr over 30 Minutes Intravenous On call to O.R. 12/02/19 0553 12/02/19 1136       Patient was given sequential compression devices, early ambulation, and chemoprophylaxis to prevent DVT.  Patient benefited maximally from hospital stay and there were no complications.    Recent vital signs:  Patient Vitals for the past 24 hrs:  BP Temp Temp src Pulse Resp SpO2  12/03/19 0717 122/62 98.6 F (37 C) Oral 78 18 98 %  12/03/19 0340 103/63 98.4 F (36.9 C) Oral 61 18 97 %  12/02/19 2309 110/67 98.2 F (36.8 C) Oral 71 18 96 %  12/02/19 2300 -- -- -- -- -- 97 %  12/02/19 2026 135/74 99 F (37.2 C) Oral 65 18 97 %  12/02/19 1631 130/79 98.9  F (37.2 C) Oral 75 18 96 %  12/02/19 1143 (!) 157/88 98.3 F (36.8 C) -- 60 19 98 %  12/02/19 1115 (!) 149/76 -- -- 62 (!) 22 99 %  12/02/19 1100 (!) 160/76 -- -- (!) 59 14 98 %  12/02/19 1045 (!) 145/73 (!) 97 F (36.1 C) -- 60 12 100 %  12/02/19 1030 140/73 -- -- (!) 54 12 99 %  12/02/19 1015 (!) 147/69 -- -- (!) 50 14 100 %  12/02/19 1000 98/79 -- -- (!) 52 16 100 %  12/02/19 0945 107/63 (!) 96.8 F (36 C) -- (!) 50 13 100 %     Recent laboratory studies:  Recent Labs    12/03/19 0522  WBC 6.6  HGB 11.0*  HCT 34.5*  PLT 112*  NA 140  K 4.4  CL 104  CO2 29  BUN 10  CREATININE 1.39*  GLUCOSE 105*  CALCIUM 8.4*     Discharge Medications:   Allergies as of 12/03/2019      Reactions   Other    NO BLOOD PRODUCTS      Medication List    TAKE these medications   aspirin EC 81 MG tablet Take 1 tablet (81 mg total) by mouth 2 (two) times daily.   docusate sodium 100 MG capsule Commonly known as: Colace Take 1 capsule (100 mg total) by mouth daily as needed.   MAGNESIUM PO Take 1 tablet by mouth  daily.   methocarbamol 750 MG tablet Commonly known as: ROBAXIN Take 1 tablet (750 mg total) by mouth 2 (two) times daily as needed for muscle spasms.   multivitamin with minerals Tabs tablet Take 1 tablet by mouth daily.   ondansetron 4 MG tablet Commonly known as: ZOFRAN Take 1-2 tablets (4-8 mg total) by mouth every 8 (eight) hours as needed for nausea or vomiting.   oxyCODONE-acetaminophen 5-325 MG tablet Commonly known as: Percocet Take 1-2 tablets by mouth every 8 (eight) hours as needed for severe pain.            Durable Medical Equipment  (From admission, onward)         Start     Ordered   12/02/19 1139  DME Walker rolling  Once    Question:  Patient needs a walker to treat with the following condition  Answer:  History of hip replacement   12/02/19 1138   12/02/19 1139  DME 3 n 1  Once     12/02/19 1138   12/02/19 1139  DME Bedside  commode  Once    Question:  Patient needs a bedside commode to treat with the following condition  Answer:  History of hip replacement   12/02/19 1138          Diagnostic Studies: DG Pelvis Portable  Result Date: 12/02/2019 CLINICAL DATA:  Postop right hip replacement. EXAM: PORTABLE PELVIS 1-2 VIEWS COMPARISON:  11/12/2019 FINDINGS: Interval right hip replacement. Gas in the soft tissues of the right hip region compatible with the immediate postoperative state. Pre-existing left hip replacement again noted. No immediate hardware complications. IMPRESSION: Status post right hip replacement without evidence for immediate hardware complications. Electronically Signed   By: Kennith Center M.D.   On: 12/02/2019 10:25   DG C-Arm 1-60 Min  Result Date: 12/02/2019 CLINICAL DATA:  Right hip replacement EXAM: OPERATIVE RIGHT HIP WITH PELVIS; DG C-ARM 1-60 MIN COMPARISON:  None. FLUOROSCOPY TIME:  Fluoroscopy Time:  27 seconds Radiation Exposure Index (if provided by the fluoroscopic device): 1.78 mGy Number of Acquired Spot Images: 2 FINDINGS: Right hip replacement is noted in satisfactory position. No acute bony or soft tissue abnormality is noted. Previously placed left hip replacement is noted as well. IMPRESSION: Status post right hip replacement Electronically Signed   By: Alcide Clever M.D.   On: 12/02/2019 09:18   DG HIP OPERATIVE UNILAT W OR W/O PELVIS RIGHT  Result Date: 12/02/2019 CLINICAL DATA:  Right hip replacement EXAM: OPERATIVE RIGHT HIP WITH PELVIS; DG C-ARM 1-60 MIN COMPARISON:  None. FLUOROSCOPY TIME:  Fluoroscopy Time:  27 seconds Radiation Exposure Index (if provided by the fluoroscopic device): 1.78 mGy Number of Acquired Spot Images: 2 FINDINGS: Right hip replacement is noted in satisfactory position. No acute bony or soft tissue abnormality is noted. Previously placed left hip replacement is noted as well. IMPRESSION: Status post right hip replacement Electronically Signed   By: Alcide Clever M.D.   On: 12/02/2019 09:18   XR HIP UNILAT W OR W/O PELVIS 2-3 VIEWS LEFT  Result Date: 11/12/2019 Stable total hip replacement without complication.  Severe right hip DJD.   Disposition: Discharge disposition: 01-Home or Self Care         Follow-up Information    Tarry Kos, MD. Go on 12/17/2019.   Specialty: Orthopedic Surgery Why: at 10:45 am for 2 week post-op at the office Contact information: [address] [place] [address] [phone]  Home, [facility] Follow up.   Specialty: Flasher Why: Someone from the home health agency will be in contact with you after your hospital discharge to discuss your first in home physical therapy appointment Contact information: [place] [address] [phone]            Signed: Aundra Dubin 12/03/2019, 8:02 AM

## 2019-12-03 NOTE — Plan of Care (Signed)
Patient alert and oriented, mae's well, voiding adequate amount of urine, swallowing without difficulty, no c/o pain at time of discharge. Patient discharged home with family. Script and discharged instructions given to patient. Patient and family stated understanding of instructions given. Patient has an appointment with Dr. Xu 

## 2019-12-03 NOTE — Progress Notes (Signed)
Subjective: 1 Day Post-Op Procedure(s) (LRB): RIGHT TOTAL HIP ARTHROPLASTY ANTERIOR APPROACH (Right) Patient reports pain as mild.    Objective: Vital signs in last 24 hours: Temp:  [96.8 F (36 C)-99 F (37.2 C)] 98.6 F (37 C) (03/23 0717) Pulse Rate:  [50-78] 78 (03/23 0717) Resp:  [12-22] 18 (03/23 0717) BP: (98-160)/(62-88) 122/62 (03/23 0717) SpO2:  [96 %-100 %] 98 % (03/23 0717)  Intake/Output from previous day: 03/22 0701 - 03/23 0700 In: 2010 [P.O.:960; I.V.:900; IV Piggyback:150] Out: 1375 [Urine:975; Blood:400] Intake/Output this shift: No intake/output data recorded.  Recent Labs    12/03/19 0522  HGB 11.0*   Recent Labs    12/03/19 0522  WBC 6.6  RBC 3.86*  HCT 34.5*  PLT 112*   Recent Labs    12/03/19 0522  NA 140  K 4.4  CL 104  CO2 29  BUN 10  CREATININE 1.39*  GLUCOSE 105*  CALCIUM 8.4*   No results for input(s): LABPT, INR in the last 72 hours.  Neurologically intact Neurovascular intact Sensation intact distally Intact pulses distally Dorsiflexion/Plantar flexion intact Incision: dressing C/D/I No cellulitis present Compartment soft   Assessment/Plan: 1 Day Post-Op Procedure(s) (LRB): RIGHT TOTAL HIP ARTHROPLASTY ANTERIOR APPROACH (Right) Advance diet Up with therapy D/C IV fluids Discharge home with home health today as long as he mobilizes well with PT.   ABLA- mild and stable F/u with Dr. Roda Shutters 2 weeks post-op for suture removal      Cristie Hem 12/03/2019, 7:59 AM

## 2019-12-03 NOTE — Progress Notes (Signed)
Physical Therapy Treatment Patient Details Name: Gerald Whitaker MRN: [id number] DOB: 25-Dec-1945 Today's Date: 12/03/2019    History of Present Illness Patient is a 74 y/o male who presents s/p Rt THA 12/02/19, direct anterior approach. PMH includes arthritis and Left THA 09/30/19.    PT Comments    Pt motivated to participate in PT, ambulated good hallway distance with use of RW. Pt somewhat limited by intermittent R groin pain, but pain typically passed quickly. Pt tolerated bed-level THA exercises well, pt expected to progress well with HHPT. Pt plans to d/c today, all acute goals met.    Follow Up Recommendations  Follow surgeon's recommendation for DC plan and follow-up therapies;Home health PT(already setup)     Equipment Recommendations  None recommended by PT    Recommendations for Other Services       Precautions / Restrictions Precautions Precautions: Fall Precaution Comments: direct anterior approach Restrictions Weight Bearing Restrictions: No    Mobility  Bed Mobility Overal bed mobility: Needs Assistance Bed Mobility: Supine to Sit;Sit to Supine     Supine to sit: HOB elevated;Min guard Sit to supine: HOB elevated;Min guard   General bed mobility comments: Min guard for safety, increased time and effort. PT taught pt to hook LLE under RLE during return to supine as pt with difficulty lifting RLE.  Transfers Overall transfer level: Needs assistance Equipment used: Rolling walker (2 wheeled) Transfers: Sit to/from Stand Sit to Stand: Supervision         General transfer comment: supervision for safety, verbal cuing for hand placement when rising.  Ambulation/Gait Ambulation/Gait assistance: Min guard Gait Distance (Feet): 150 Feet Assistive device: Rolling walker (2 wheeled) Gait Pattern/deviations: Step-through pattern;Decreased stride length;Trunk flexed Gait velocity: decr   General Gait Details: Min guard for safety, pt with x2 shooting pains in  R groin during mobility which passed quickly. Verbal cuing for placement in RW x2, upright posture.   Stairs             Wheelchair Mobility    Modified Rankin (Stroke Patients Only)       Balance Overall balance assessment: Needs assistance Sitting-balance support: Feet supported;No upper extremity supported Sitting balance-Leahy Scale: Good     Standing balance support: During functional activity Standing balance-Leahy Scale: Poor Standing balance comment: Requires UE support.                            Cognition Arousal/Alertness: Awake/alert Behavior During Therapy: WFL for tasks assessed/performed Overall Cognitive Status: Within Functional Limits for tasks assessed                                        Exercises Total Joint Exercises Ankle Circles/Pumps: AROM;Both;10 reps;Supine Quad Sets: AROM;10 reps;Supine;Right Heel Slides: AROM;Right;10 reps;Supine    General Comments        Pertinent Vitals/Pain Pain Assessment: 0-10 Pain Score: 2  Pain Location: R hip during mobility Pain Descriptors / Indicators: Sore Pain Intervention(s): Limited activity within patient's tolerance;Monitored during session;Repositioned    Home Living                      Prior Function            PT Goals (current goals can now be found in the care plan section) Acute Rehab PT Goals Patient Stated Goal: to get  back to working without pain PT Goal Formulation: With patient Time For Goal Achievement: 12/16/19 Potential to Achieve Goals: Good Progress towards PT goals: Progressing toward goals    Frequency    7X/week      PT Plan Current plan remains appropriate    Co-evaluation              AM-PAC PT "6 Clicks" Mobility   Outcome Measure  Help needed turning from your back to your side while in a flat bed without using bedrails?: None Help needed moving from lying on your back to sitting on the side of a flat bed  without using bedrails?: None Help needed moving to and from a bed to a chair (including a wheelchair)?: A Little Help needed standing up from a chair using your arms (e.g., wheelchair or bedside chair)?: A Little Help needed to walk in hospital room?: A Little Help needed climbing 3-5 steps with a railing? : A Little 6 Click Score: 20    End of Session Equipment Utilized During Treatment: Gait belt Activity Tolerance: Patient tolerated treatment well Patient left: in bed;with call bell/phone within reach Nurse Communication: Mobility status PT Visit Diagnosis: Pain Pain - Right/Left: Right Pain - part of body: Hip(back)     Time: 5825-1898 PT Time Calculation (min) (ACUTE ONLY): 15 min  Charges:  $Gait Training: 8-22 mins                     Anarely Nicholls E, [place] Pager [phone]  Office [phone]    Takuya Lariccia D Ignatz Deis 12/03/2019, 2:45 PM

## 2019-12-03 NOTE — Discharge Instructions (Signed)

## 2019-12-04 ENCOUNTER — Telehealth: Payer: Self-pay | Admitting: *Deleted

## 2019-12-04 ENCOUNTER — Other Ambulatory Visit: Payer: Self-pay | Admitting: Physician Assistant

## 2019-12-04 DIAGNOSIS — Z87891 Personal history of nicotine dependence: Secondary | ICD-10-CM | POA: Diagnosis not present

## 2019-12-04 DIAGNOSIS — Z96643 Presence of artificial hip joint, bilateral: Secondary | ICD-10-CM | POA: Diagnosis not present

## 2019-12-04 DIAGNOSIS — Z471 Aftercare following joint replacement surgery: Secondary | ICD-10-CM | POA: Diagnosis not present

## 2019-12-04 MED ORDER — METHOCARBAMOL 750 MG PO TABS: 750.0000 mg | ORAL_TABLET | Freq: Two times a day (BID) | ORAL | 3 refills | Status: DC | PRN

## 2019-12-04 NOTE — Telephone Encounter (Signed)
Ortho bundle D/C call completed. 

## 2019-12-07 DIAGNOSIS — Z471 Aftercare following joint replacement surgery: Secondary | ICD-10-CM | POA: Diagnosis not present

## 2019-12-07 DIAGNOSIS — Z87891 Personal history of nicotine dependence: Secondary | ICD-10-CM | POA: Diagnosis not present

## 2019-12-07 DIAGNOSIS — Z96643 Presence of artificial hip joint, bilateral: Secondary | ICD-10-CM | POA: Diagnosis not present

## 2019-12-09 ENCOUNTER — Telehealth: Payer: Self-pay | Admitting: *Deleted

## 2019-12-09 DIAGNOSIS — K30 Functional dyspepsia: Secondary | ICD-10-CM | POA: Diagnosis not present

## 2019-12-09 DIAGNOSIS — Z96643 Presence of artificial hip joint, bilateral: Secondary | ICD-10-CM | POA: Diagnosis not present

## 2019-12-09 DIAGNOSIS — K5909 Other constipation: Secondary | ICD-10-CM | POA: Diagnosis not present

## 2019-12-09 DIAGNOSIS — Z471 Aftercare following joint replacement surgery: Secondary | ICD-10-CM | POA: Diagnosis not present

## 2019-12-09 DIAGNOSIS — Z96642 Presence of left artificial hip joint: Secondary | ICD-10-CM | POA: Diagnosis not present

## 2019-12-09 DIAGNOSIS — Z87891 Personal history of nicotine dependence: Secondary | ICD-10-CM | POA: Diagnosis not present

## 2019-12-09 DIAGNOSIS — N401 Enlarged prostate with lower urinary tract symptoms: Secondary | ICD-10-CM | POA: Diagnosis not present

## 2019-12-09 DIAGNOSIS — M545 Low back pain: Secondary | ICD-10-CM | POA: Diagnosis not present

## 2019-12-09 NOTE — Telephone Encounter (Signed)
7 day Ortho bundle call completed. 

## 2019-12-11 DIAGNOSIS — Z471 Aftercare following joint replacement surgery: Secondary | ICD-10-CM | POA: Diagnosis not present

## 2019-12-11 DIAGNOSIS — Z87891 Personal history of nicotine dependence: Secondary | ICD-10-CM | POA: Diagnosis not present

## 2019-12-11 DIAGNOSIS — Z96643 Presence of artificial hip joint, bilateral: Secondary | ICD-10-CM | POA: Diagnosis not present

## 2019-12-13 DIAGNOSIS — Z87891 Personal history of nicotine dependence: Secondary | ICD-10-CM | POA: Diagnosis not present

## 2019-12-13 DIAGNOSIS — Z96643 Presence of artificial hip joint, bilateral: Secondary | ICD-10-CM | POA: Diagnosis not present

## 2019-12-13 DIAGNOSIS — Z471 Aftercare following joint replacement surgery: Secondary | ICD-10-CM | POA: Diagnosis not present

## 2019-12-16 ENCOUNTER — Telehealth: Payer: Self-pay | Admitting: Orthopaedic Surgery

## 2019-12-16 ENCOUNTER — Telehealth: Payer: Self-pay | Admitting: *Deleted

## 2019-12-16 ENCOUNTER — Other Ambulatory Visit: Payer: Self-pay | Admitting: Physician Assistant

## 2019-12-16 MED ORDER — OXYCODONE-ACETAMINOPHEN 5-325 MG PO TABS: 1.0000 | ORAL_TABLET | Freq: Three times a day (TID) | ORAL | 0 refills | Status: DC | PRN

## 2019-12-16 NOTE — Telephone Encounter (Signed)
Just sent in

## 2019-12-16 NOTE — Telephone Encounter (Signed)
Thanks for doing it again. I'll let him know.

## 2019-12-16 NOTE — Telephone Encounter (Signed)
Traci with [facility] called stating she believes [facility] sent the pt prescription to the wrong pharmacy, she stated she voided the order and just wanted to make [place] aware.   Traci # [phone]

## 2019-12-16 NOTE — Telephone Encounter (Signed)
Ortho bundle call completed 14 days.

## 2019-12-16 NOTE — Telephone Encounter (Signed)
Ok, I just sent in

## 2019-12-16 NOTE — Telephone Encounter (Signed)
Patient called requesting refill of pain medication. Attempted to get refill last week while office was closed. Was able to pick up refill of muscle relaxer however. Thanks.

## 2019-12-16 NOTE — Telephone Encounter (Signed)
I changed his pharmacy in Epic..he doesn't use the [facility] pharmacy. Sorry about that. It's correct now- will you reorder it.

## 2019-12-17 ENCOUNTER — Telehealth: Payer: Self-pay | Admitting: *Deleted

## 2019-12-17 ENCOUNTER — Other Ambulatory Visit: Payer: Self-pay

## 2019-12-17 ENCOUNTER — Ambulatory Visit (INDEPENDENT_AMBULATORY_CARE_PROVIDER_SITE_OTHER): Payer: Medicare HMO | Admitting: Physician Assistant

## 2019-12-17 ENCOUNTER — Encounter: Payer: Self-pay | Admitting: Orthopaedic Surgery

## 2019-12-17 DIAGNOSIS — Z96641 Presence of right artificial hip joint: Secondary | ICD-10-CM

## 2019-12-17 NOTE — Telephone Encounter (Signed)
Corrected yesterday

## 2019-12-17 NOTE — Progress Notes (Signed)
   Post-Op Visit Note   Patient: Gerald Whitaker           Date of Birth: 1945/09/21           MRN: [id number] Visit Date: 12/17/2019 PCP: Gaspar Garbe, MD   Assessment & Plan:  Chief Complaint:  Chief Complaint  Patient presents with  . Left Hip - Routine Post Op, Follow-up   Visit Diagnoses:  1. H/O total hip arthroplasty, right     Plan: Patient is a very pleasant 74 year old gentleman who comes in today 2 weeks out right total hip replacement 12/02/2019.  He has been doing very well.  He has recently finished home health physical therapy where he is ambulating with a cane.  He is having slight pain to his right buttocks when trying to sit for period of time as well as down the anterior thigh which sounds like nerve irritation.  He does have a history of back problems but does not take anything for nerve pain such as gabapentin.  Examination of his right hip reveals a fully healed surgical scar without complication.  Calves are soft and nontender.  He is neurovascular intact distally.  At this point, Steri-Strips were applied.  Dental prophylaxis reinforced.  He will continue with his home exercise program.  He will follow-up with [place] in 4 weeks time for repeat evaluation and AP pelvis lateral right hip x-rays.  At that point, we will also recheck his left total hip arthroplasty that was from 09/30/2019.  We will call with concerns or questions in the meantime.  Follow-Up Instructions: Return in about 4 weeks (around 01/14/2020).   Orders:  No orders of the defined types were placed in this encounter.  No orders of the defined types were placed in this encounter.   Imaging: No new imaging  PMFS History: Patient Active Problem List   Diagnosis Date Noted  . Status post total replacement of right hip 12/02/2019  . Status post total replacement of left hip 09/30/2019  . Primary osteoarthritis of right hip 08/23/2019   Past Medical History:  Diagnosis Date  . Arthritis       History reviewed. No pertinent family history.  Past Surgical History:  Procedure Laterality Date  . TOTAL HIP ARTHROPLASTY Left 09/30/2019   Procedure: LEFT TOTAL HIP ARTHROPLASTY ANTERIOR APPROACH;  Surgeon: Tarry Kos, MD;  Location: [facility];  Service: Orthopedics;  Laterality: Left;  . TOTAL HIP ARTHROPLASTY Right 12/02/2019   Procedure: RIGHT TOTAL HIP ARTHROPLASTY ANTERIOR APPROACH;  Surgeon: Tarry Kos, MD;  Location: [facility];  Service: Orthopedics;  Laterality: Right;   Social History   Occupational History  . Not on file  Tobacco Use  . Smoking status: Former Smoker    Types: Cigarettes    Quit date: 03/16/1983    Years since quitting: 36.7  . Smokeless tobacco: Never Used  Substance and Sexual Activity  . Alcohol use: Not Currently    Comment: rarely  . Drug use: Never  . Sexual activity: Not on file

## 2019-12-17 NOTE — Telephone Encounter (Signed)
14 day Ortho bundle in office visit.

## 2020-01-03 ENCOUNTER — Telehealth: Payer: Self-pay | Admitting: *Deleted

## 2020-01-03 ENCOUNTER — Other Ambulatory Visit: Payer: Self-pay | Admitting: Physician Assistant

## 2020-01-03 MED ORDER — HYDROCODONE-ACETAMINOPHEN 5-325 MG PO TABS: 1.0000 | ORAL_TABLET | Freq: Every day | ORAL | 0 refills | Status: DC | PRN

## 2020-01-03 NOTE — Telephone Encounter (Signed)
I sent the request to both of you. He must've done it before you could see it. Thanks.

## 2020-01-03 NOTE — Telephone Encounter (Signed)
Was going to send in norco, but it looks like xu sent that in already today

## 2020-01-03 NOTE — Telephone Encounter (Signed)
Spoke to patient for 1 month bundle call. He requested if he could have refill of his pain medication. Pharmacy-[facility] on [address] at [address]. Thanks.

## 2020-01-04 NOTE — Telephone Encounter (Signed)
He is quick!

## 2020-01-06 ENCOUNTER — Other Ambulatory Visit: Payer: Self-pay | Admitting: Orthopaedic Surgery

## 2020-01-06 NOTE — Telephone Encounter (Signed)
Only needs 6 weeks po

## 2020-01-14 ENCOUNTER — Ambulatory Visit (INDEPENDENT_AMBULATORY_CARE_PROVIDER_SITE_OTHER): Payer: Medicare HMO

## 2020-01-14 ENCOUNTER — Ambulatory Visit (INDEPENDENT_AMBULATORY_CARE_PROVIDER_SITE_OTHER): Payer: Medicare HMO | Admitting: Orthopaedic Surgery

## 2020-01-14 ENCOUNTER — Other Ambulatory Visit: Payer: Self-pay

## 2020-01-14 ENCOUNTER — Encounter: Payer: Self-pay | Admitting: Orthopaedic Surgery

## 2020-01-14 DIAGNOSIS — Z96641 Presence of right artificial hip joint: Secondary | ICD-10-CM

## 2020-01-14 NOTE — Progress Notes (Signed)
   Post-Op Visit Note   Patient: Gerald Whitaker           Date of Birth: 03/18/1946           MRN: [id number] Visit Date: 01/14/2020 PCP: Gaspar Garbe, MD   Assessment & Plan:  Chief Complaint:  Chief Complaint  Patient presents with  . Right Hip - Pain   Visit Diagnoses:  1. H/O total hip arthroplasty, right     Plan: Patient is 6 weeks status post right total hip replacement.  He is 1-month status post left total hip replacement.  He is doing well overall and is very happy with his recovery.  He takes an occasional hydrocodone at night.  He is ambulating with a cane.  Surgical scar is healed without any signs of infection.  At this point he can increase activity as tolerated.  Dental prophylaxis reinforced.  Recheck in 6 weeks.  Follow-Up Instructions: Return in about 6 weeks (around 02/25/2020).   Orders:  Orders Placed This Encounter  Procedures  . XR HIP UNILAT W OR W/O PELVIS 2-3 VIEWS RIGHT   No orders of the defined types were placed in this encounter.   Imaging: XR HIP UNILAT W OR W/O PELVIS 2-3 VIEWS RIGHT  Result Date: 01/14/2020 Stable right total hip replacement without complication.   PMFS History: Patient Active Problem List   Diagnosis Date Noted  . Status post total replacement of right hip 12/02/2019  . Status post total replacement of left hip 09/30/2019  . Primary osteoarthritis of right hip 08/23/2019   Past Medical History:  Diagnosis Date  . Arthritis     History reviewed. No pertinent family history.  Past Surgical History:  Procedure Laterality Date  . TOTAL HIP ARTHROPLASTY Left 09/30/2019   Procedure: LEFT TOTAL HIP ARTHROPLASTY ANTERIOR APPROACH;  Surgeon: Tarry Kos, MD;  Location: [facility];  Service: Orthopedics;  Laterality: Left;  . TOTAL HIP ARTHROPLASTY Right 12/02/2019   Procedure: RIGHT TOTAL HIP ARTHROPLASTY ANTERIOR APPROACH;  Surgeon: Tarry Kos, MD;  Location: [facility];  Service: Orthopedics;  Laterality: Right;   Social  History   Occupational History  . Not on file  Tobacco Use  . Smoking status: Former Smoker    Types: Cigarettes    Quit date: 03/16/1983    Years since quitting: 36.8  . Smokeless tobacco: Never Used  Substance and Sexual Activity  . Alcohol use: Not Currently    Comment: rarely  . Drug use: Never  . Sexual activity: Not on file

## 2020-02-25 ENCOUNTER — Encounter: Payer: Self-pay | Admitting: Orthopaedic Surgery

## 2020-02-25 ENCOUNTER — Ambulatory Visit: Payer: Medicare HMO | Admitting: Orthopaedic Surgery

## 2020-02-25 ENCOUNTER — Telehealth: Payer: Self-pay | Admitting: *Deleted

## 2020-02-25 DIAGNOSIS — Z96641 Presence of right artificial hip joint: Secondary | ICD-10-CM

## 2020-02-25 DIAGNOSIS — Z96642 Presence of left artificial hip joint: Secondary | ICD-10-CM

## 2020-02-25 NOTE — Progress Notes (Signed)
   Post-Op Visit Note   Patient: Gerald Whitaker           Date of Birth: 18-Dec-1945           MRN: [id number] Visit Date: 02/25/2020 PCP: Gaspar Garbe, MD   Assessment & Plan:  Chief Complaint:  Chief Complaint  Patient presents with  . Right Hip - Pain   Visit Diagnoses:  1. Status post total replacement of left hip   2. Status post total replacement of right hip     Plan: 3 month THA follow up plan  Patient now 3 months status post total hip arthroplasty. Wound is healed with no signs of complications or infection.  The patient does not complain of pain, and is back to normal daily activities. It was reinforced that prophylactic antibiotics should be taken with any procedure including but not limited to dental work or colonoscopies.  We will plan on following up at the 6 month postop visit with radiographs at that time. As always, instructions were given to call with any questions or concerns in the interim.  He has some pitting edema that improves with elevation.  He should wear compression socks.  He should see his PCP if this gets worse.   Follow-Up Instructions: Return in about 3 months (around 05/27/2020).   Orders:  No orders of the defined types were placed in this encounter.  No orders of the defined types were placed in this encounter.   Imaging: No results found.  PMFS History: Patient Active Problem List   Diagnosis Date Noted  . Status post total replacement of right hip 12/02/2019  . Status post total replacement of left hip 09/30/2019  . Primary osteoarthritis of right hip 08/23/2019   Past Medical History:  Diagnosis Date  . Arthritis     History reviewed. No pertinent family history.  Past Surgical History:  Procedure Laterality Date  . TOTAL HIP ARTHROPLASTY Left 09/30/2019   Procedure: LEFT TOTAL HIP ARTHROPLASTY ANTERIOR APPROACH;  Surgeon: Tarry Kos, MD;  Location: [facility];  Service: Orthopedics;  Laterality: Left;  . TOTAL HIP  ARTHROPLASTY Right 12/02/2019   Procedure: RIGHT TOTAL HIP ARTHROPLASTY ANTERIOR APPROACH;  Surgeon: Tarry Kos, MD;  Location: [facility];  Service: Orthopedics;  Laterality: Right;   Social History   Occupational History  . Not on file  Tobacco Use  . Smoking status: Former Smoker    Types: Cigarettes    Quit date: 03/16/1983    Years since quitting: 36.9  . Smokeless tobacco: Never Used  Vaping Use  . Vaping Use: Never used  Substance and Sexual Activity  . Alcohol use: Not Currently    Comment: rarely  . Drug use: Never  . Sexual activity: Not on file

## 2020-02-25 NOTE — Telephone Encounter (Signed)
Ortho bundle 90 day call completed for R-THA done on 12/02/19. Patient is 85 days S/P replacement at today's visit.

## 2020-05-27 ENCOUNTER — Ambulatory Visit: Payer: Medicare HMO | Admitting: Orthopaedic Surgery

## 2020-05-27 ENCOUNTER — Ambulatory Visit (INDEPENDENT_AMBULATORY_CARE_PROVIDER_SITE_OTHER): Payer: Medicare HMO

## 2020-05-27 ENCOUNTER — Encounter: Payer: Self-pay | Admitting: Orthopaedic Surgery

## 2020-05-27 DIAGNOSIS — Z96642 Presence of left artificial hip joint: Secondary | ICD-10-CM

## 2020-05-27 DIAGNOSIS — Z96643 Presence of artificial hip joint, bilateral: Secondary | ICD-10-CM

## 2020-05-27 DIAGNOSIS — Z96641 Presence of right artificial hip joint: Secondary | ICD-10-CM

## 2020-05-27 NOTE — Progress Notes (Signed)
   Office Visit Note   Patient: Gerald Whitaker           Date of Birth: 1945/11/13           MRN: [id number] Visit Date: 05/27/2020              Requested by: Gaspar Garbe, MD [address] [place],  [place] [address] PCP: Wylene Simmer Adelfa Koh, MD   Assessment & Plan: Visit Diagnoses:  1. Status post total replacement of left hip   2. Status post total replacement of right hip     Plan: Dental prophylaxis was reinforced today.  He will continue to advance activity as tolerated.  He will continue with home exercises for strengthening.  Recheck in 6 months with standing AP pelvis x-rays.  Follow-Up Instructions: Return in about 6 months (around 11/24/2020).   Orders:  Orders Placed This Encounter  Procedures  . XR HIPS BILAT W OR W/O PELVIS 3-4 VIEWS   No orders of the defined types were placed in this encounter.     Procedures: No procedures performed   Clinical Data: No additional findings.   Subjective: Chief Complaint  Patient presents with  . Right Hip - Routine Post Op    Mr. Sites is status post left total hip replacement January and status post right total hip replacement in March.  He is doing well has no real complaints.  Overall he is very happy with everything.   Review of Systems   Objective: Vital Signs: There were no vitals taken for this visit.  Physical Exam  Ortho Exam Bilateral hips show fully healed surgical scars.  Leg lengths are equal. Specialty Comments:  No specialty comments available.  Imaging: XR HIPS BILAT W OR W/O PELVIS 3-4 VIEWS  Result Date: 05/27/2020 Stable bilateral total hip replacements without complications.    PMFS History: Patient Active Problem List   Diagnosis Date Noted  . Status post total replacement of right hip 12/02/2019  . Status post total replacement of left hip 09/30/2019  . Primary osteoarthritis of right hip 08/23/2019   Past Medical History:  Diagnosis Date  . Arthritis     History  reviewed. No pertinent family history.  Past Surgical History:  Procedure Laterality Date  . TOTAL HIP ARTHROPLASTY Left 09/30/2019   Procedure: LEFT TOTAL HIP ARTHROPLASTY ANTERIOR APPROACH;  Surgeon: Tarry Kos, MD;  Location: [facility];  Service: Orthopedics;  Laterality: Left;  . TOTAL HIP ARTHROPLASTY Right 12/02/2019   Procedure: RIGHT TOTAL HIP ARTHROPLASTY ANTERIOR APPROACH;  Surgeon: Tarry Kos, MD;  Location: [facility];  Service: Orthopedics;  Laterality: Right;   Social History   Occupational History  . Not on file  Tobacco Use  . Smoking status: Former Smoker    Types: Cigarettes    Quit date: 03/16/1983    Years since quitting: 37.2  . Smokeless tobacco: Never Used  Vaping Use  . Vaping Use: Never used  Substance and Sexual Activity  . Alcohol use: Not Currently    Comment: rarely  . Drug use: Never  . Sexual activity: Not on file

## 2020-09-23 DIAGNOSIS — R7989 Other specified abnormal findings of blood chemistry: Secondary | ICD-10-CM | POA: Diagnosis not present

## 2020-09-23 DIAGNOSIS — R972 Elevated prostate specific antigen [PSA]: Secondary | ICD-10-CM | POA: Diagnosis not present

## 2020-09-23 DIAGNOSIS — D699 Hemorrhagic condition, unspecified: Secondary | ICD-10-CM | POA: Diagnosis not present

## 2020-09-23 DIAGNOSIS — Z125 Encounter for screening for malignant neoplasm of prostate: Secondary | ICD-10-CM | POA: Diagnosis not present

## 2020-09-30 DIAGNOSIS — D696 Thrombocytopenia, unspecified: Secondary | ICD-10-CM | POA: Diagnosis not present

## 2020-09-30 DIAGNOSIS — Z0000 Encounter for general adult medical examination without abnormal findings: Secondary | ICD-10-CM | POA: Diagnosis not present

## 2020-09-30 DIAGNOSIS — Z1212 Encounter for screening for malignant neoplasm of rectum: Secondary | ICD-10-CM | POA: Diagnosis not present

## 2020-09-30 DIAGNOSIS — N401 Enlarged prostate with lower urinary tract symptoms: Secondary | ICD-10-CM | POA: Diagnosis not present

## 2020-09-30 DIAGNOSIS — K5909 Other constipation: Secondary | ICD-10-CM | POA: Diagnosis not present

## 2020-09-30 DIAGNOSIS — K30 Functional dyspepsia: Secondary | ICD-10-CM | POA: Diagnosis not present

## 2020-09-30 DIAGNOSIS — R82998 Other abnormal findings in urine: Secondary | ICD-10-CM | POA: Diagnosis not present

## 2020-09-30 DIAGNOSIS — M545 Low back pain, unspecified: Secondary | ICD-10-CM | POA: Diagnosis not present

## 2020-09-30 DIAGNOSIS — H9319 Tinnitus, unspecified ear: Secondary | ICD-10-CM | POA: Diagnosis not present

## 2020-09-30 DIAGNOSIS — R972 Elevated prostate specific antigen [PSA]: Secondary | ICD-10-CM | POA: Diagnosis not present

## 2020-09-30 DIAGNOSIS — Z96642 Presence of left artificial hip joint: Secondary | ICD-10-CM | POA: Diagnosis not present

## 2020-10-26 IMAGING — RF DG C-ARM 1-60 MIN
1 series · 2 of 2 positions shown · non-contrast
Comparison: None.

CLINICAL DATA: Right hip replacement

EXAM:
OPERATIVE RIGHT HIP WITH PELVIS; DG C-ARM 1-60 MIN

[Series 1: unknown protocol · 0.20mm/px · 2 of 2 slices shown]
[im 1/2]
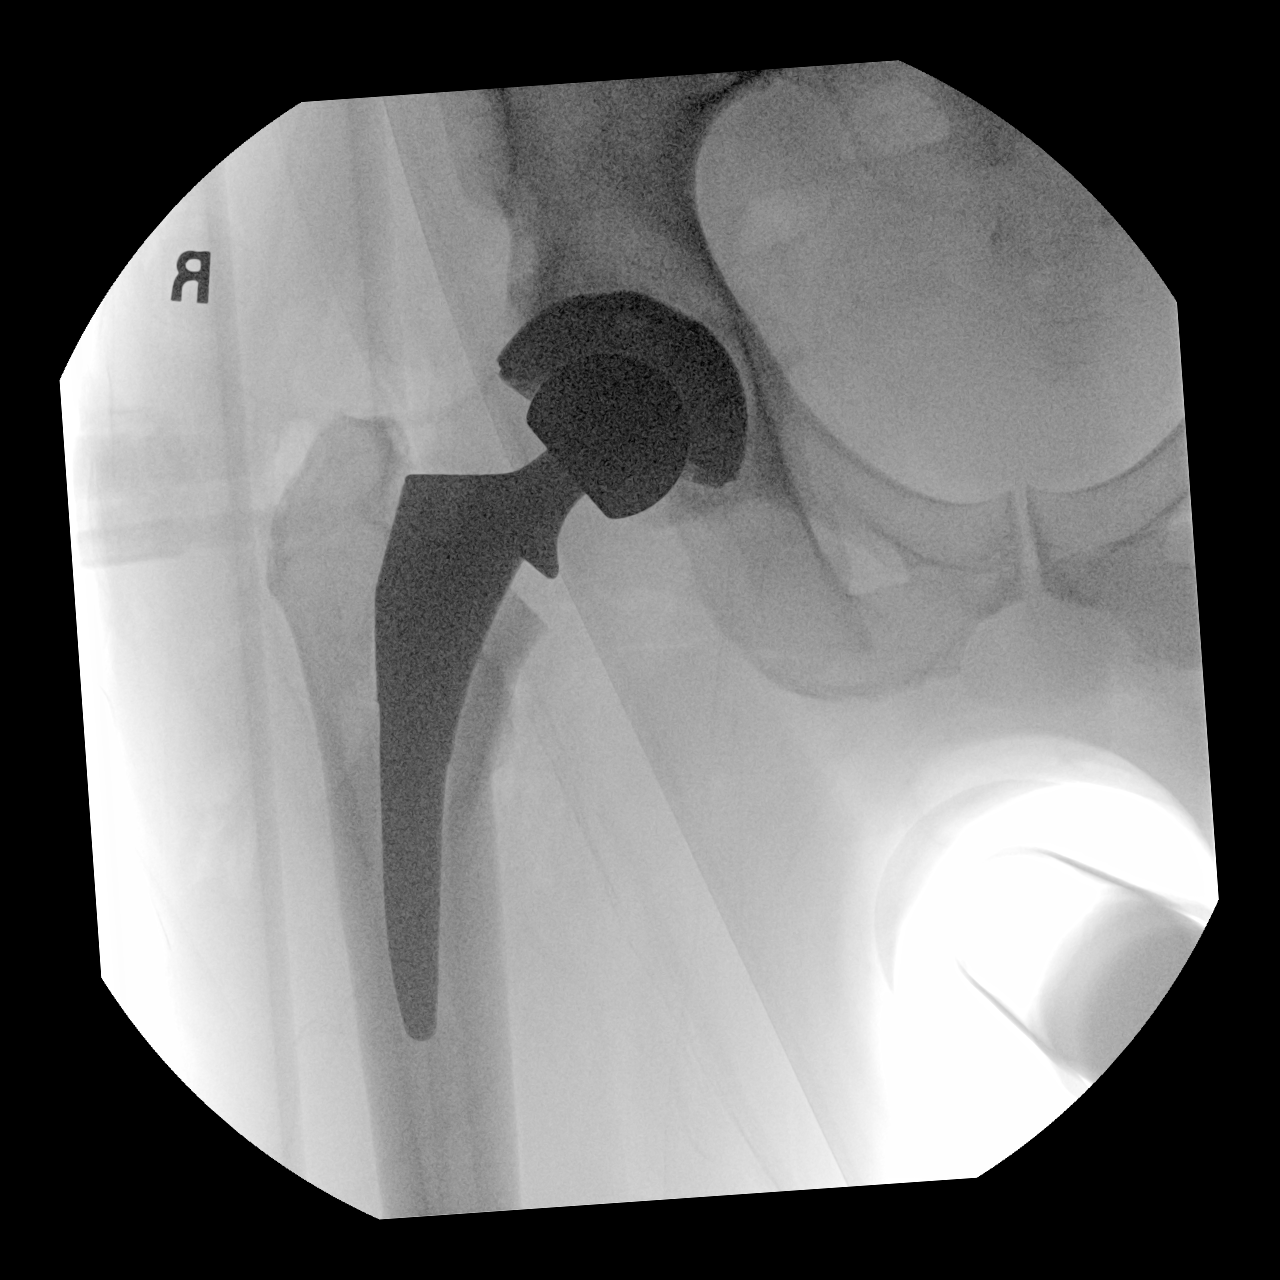
[im 2/2]
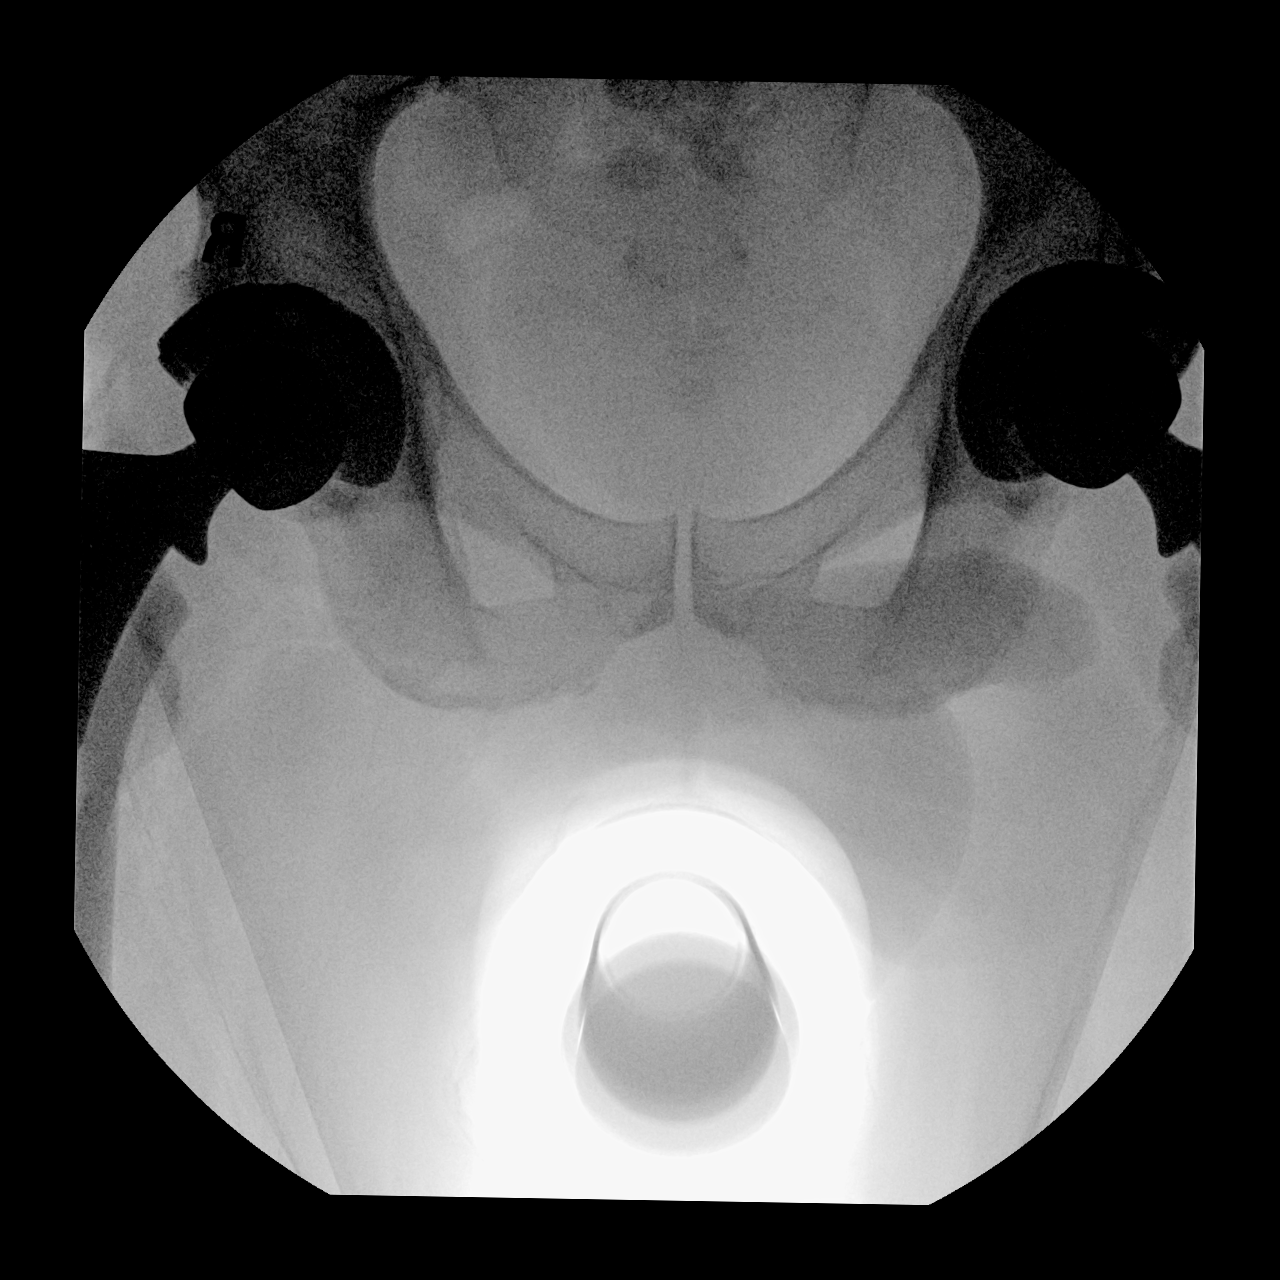

[2 of 2 positions shown; findings below may reference images not displayed]

FLUOROSCOPY TIME:  Fluoroscopy Time:  27 seconds

Radiation Exposure Index (if provided by the fluoroscopic device):
1.78 mGy

Number of Acquired Spot Images: 2
FINDINGS: Right hip replacement is noted in satisfactory position. No acute
bony or soft tissue abnormality is noted. Previously placed left hip
replacement is noted as well.
IMPRESSION: Status post right hip replacement

## 2020-11-13 ENCOUNTER — Telehealth: Payer: Self-pay | Admitting: *Deleted

## 2020-11-13 NOTE — Telephone Encounter (Signed)
Ortho bundle 1 year post op call completed. ?

## 2020-11-24 ENCOUNTER — Ambulatory Visit: Payer: Medicare HMO | Admitting: Orthopaedic Surgery

## 2020-11-24 ENCOUNTER — Ambulatory Visit (INDEPENDENT_AMBULATORY_CARE_PROVIDER_SITE_OTHER): Payer: Medicare HMO

## 2020-11-24 ENCOUNTER — Other Ambulatory Visit: Payer: Self-pay

## 2020-11-24 ENCOUNTER — Encounter: Payer: Self-pay | Admitting: Physician Assistant

## 2020-11-24 DIAGNOSIS — Z96641 Presence of right artificial hip joint: Secondary | ICD-10-CM

## 2020-11-24 DIAGNOSIS — Z96643 Presence of artificial hip joint, bilateral: Secondary | ICD-10-CM | POA: Diagnosis not present

## 2020-11-24 DIAGNOSIS — Z96642 Presence of left artificial hip joint: Secondary | ICD-10-CM

## 2020-11-24 NOTE — Progress Notes (Signed)
   Office Visit Note   Patient: Gerald Whitaker           Date of Birth: [birth year]           MRN: [id number] Visit Date: 11/24/2020              Requested by: Gaspar Garbe, MD [address] [place],  [place] [address] PCP: Wylene Simmer Adelfa Koh, MD   Assessment & Plan: Visit Diagnoses:  1. Status post total replacement of left hip   2. Status post total replacement of right hip     Plan: Impression is 1 year status post sequential bilateral total hip replacements.  He is continuing to do well and may continue to progress with activity.  Dental prophylaxis reinforced for another year.  Follow-up with [place] in 1 years time for repeat evaluation and AP pelvis x-rays.  Call with concerns or questions.  Follow-Up Instructions: Return in about 1 year (around 11/24/2021).   Orders:  Orders Placed This Encounter  Procedures  . XR Pelvis 1-2 Views   No orders of the defined types were placed in this encounter.     Procedures: No procedures performed   Clinical Data: No additional findings.   Subjective: Chief Complaint  Patient presents with  . Left Hip - Follow-up  . Right Hip - Follow-up    HPI patient is a very pleasant 75 year old gentleman who comes in today little over a year out left total hip replacement 09/30/2019 and 1 year out right total hip replacement 12/02/2019.  He has been doing excellent.  No pain.  He has been working in his garden without any issues.       Objective: Vital Signs: There were no vitals taken for this visit.    Ortho Exam examination of both hips reveals full hip flexion.  Negative logroll.  Is neurovascular intact distally.  Specialty Comments:  No specialty comments available.  Imaging: XR Pelvis 1-2 Views  Result Date: 11/24/2020 Well-seated prosthesis without complication    PMFS History: Patient Active Problem List   Diagnosis Date Noted  . Status post total replacement of right hip 12/02/2019  . Status post total  replacement of left hip 09/30/2019  . Primary osteoarthritis of right hip 08/23/2019   Past Medical History:  Diagnosis Date  . Arthritis     History reviewed. No pertinent family history.  Past Surgical History:  Procedure Laterality Date  . TOTAL HIP ARTHROPLASTY Left 09/30/2019   Procedure: LEFT TOTAL HIP ARTHROPLASTY ANTERIOR APPROACH;  Surgeon: Tarry Kos, MD;  Location: [facility];  Service: Orthopedics;  Laterality: Left;  . TOTAL HIP ARTHROPLASTY Right 12/02/2019   Procedure: RIGHT TOTAL HIP ARTHROPLASTY ANTERIOR APPROACH;  Surgeon: Tarry Kos, MD;  Location: [facility];  Service: Orthopedics;  Laterality: Right;   Social History   Occupational History  . Not on file  Tobacco Use  . Smoking status: Former Smoker    Types: Cigarettes    Quit date: 03/16/1983    Years since quitting: 37.7  . Smokeless tobacco: Never Used  Vaping Use  . Vaping Use: Never used  Substance and Sexual Activity  . Alcohol use: Not Currently    Comment: rarely  . Drug use: Never  . Sexual activity: Not on file

## 2022-04-22 ENCOUNTER — Other Ambulatory Visit: Payer: Self-pay | Admitting: Surgery

## 2022-04-22 ENCOUNTER — Other Ambulatory Visit (HOSPITAL_COMMUNITY): Payer: Self-pay | Admitting: Surgery

## 2022-04-22 DIAGNOSIS — E041 Nontoxic single thyroid nodule: Secondary | ICD-10-CM

## 2022-04-25 ENCOUNTER — Encounter: Payer: Self-pay | Admitting: *Deleted

## 2022-04-25 NOTE — Progress Notes (Unsigned)
Suttle, [name], MD  [name] Approved for US guided FNA of right mid nodule, TR3 2.8 cm.   Gerald Whitaker

## 2022-04-27 ENCOUNTER — Encounter: Payer: Self-pay | Admitting: *Deleted

## 2022-04-27 NOTE — Progress Notes (Unsigned)
Oley Balm, MD  [name] Ok   [place] FNA RLP TR3  DRI ok   Thx  DDH    [facility] [place] report:  (Case [id number] COMPLETE) US THYROID ([place] Detailed) [id number]  Reason for Study: right inferior thyroid nodule   Clinical History:   Report Status: Verified Date Reported: Apr 04, 2022  Date Verified: Apr 04, 2022  Verifier E-Sig:/[email or web address] Magnus Ivan, MD   Report:  History: right inferior thyroid nodule   Comparison: CT thorax dated January 03, 2022 3   Technique: Ultrasound of the thyroid gland and adjacent soft  tissues was performed.   Findings:   Thyroid size: Right lobe of the thyroid measures 5.2 x 2.1 x 2.4  cm while the left measures 5 x 2 x 2.1 cm. The isthmus is 4 mm in  thickness.   Echotexture: Homogeneous   Estimated total number of nodules greater than or equal to 1 cm:  2   Nodule #: 1 Maximum size: 1.9 cm; other 2 dimensions 1.8 x 1.2  cm. Location: left; lower pole   Composition: Mixed cystic and solid   Echogenicity: Hypoechoic   Shape: Not taller than wide   Margins: Ill-defined   Echogenic foci: Punctate echogenic foci   ACR TI-RADS total points: 6   ACR TI-RADS risk category: 4   ACR TI-RADS recommendation: Based on size, consideration of  sampling is advised.     Nodule #: 2. Maximum size: 2.8 cm; other 2 dimensions 2.6 x 2.2  cm. Location: right; lower pole. Of note, on cine images, there  is a thin echogenic fat plane between some of the margins of the  nodule and the thyroid parenchyma, raising the question of  whether this represents an enlarged nodule distinct from the  thyroid gland versus a somewhat exophytic nodule.   Composition: Solid   Echogenicity: Isoechoic   Shape: Not taller than wide   Margins: Smooth   Echogenic foci: None   ACR TI-RADS total points: 3   ACR TI-RADS risk category: 3   ACR TI-RADS recommendation: FNA sampling based on size.       Impression:    Nodules as above; the left nodule is  within the thyroid  parenchyma and has mixed cystic and solid, possibly even a  somewhat spongiform morphology but also has possible punctate  internal calcifications while the right nodule arises at the  lower pole and on some of the images appears to be distinct from  the thyroid parenchyma. Based on size, sampling would be  recommended under current guidelines. Endocrinology evaluation is  recommended.   ACR TI-RADS Recommendations:   TR5 (greater than or equal to 7 points) - FNA if greater than or  equal to 1 cm, follow up if 0.5 to 0.9 cm every year for 5 years.   TR4 (4 to 6 points) - FNA if greater than or equal to 1.5 cm,  follow up if 1 to 1.4 cm in 1, 2, 3, and 5 years.   TR3 (3 points) - FNA if greater than or equal to 2.5 cm, follow  up if 1.5 to 2.4 cm in 1, 3, and 5 years.   TR2 (2 points) and TR 1 (0 points) - No FNA or follow up.   Electronically Signed By: Irish Lack Electronically Signed  On: 04/04/2022 12:57 PM   Primary Diagnostic Code: SIGNIFICANT ABNORMALITY, ATTN NEEDED   Primary Interpreting Staff:  Irish Lack, MD, RADIOLOGIST (Verifier)  Verdie Mosher

## 2022-05-17 ENCOUNTER — Ambulatory Visit (HOSPITAL_COMMUNITY)
Admission: RE | Admit: 2022-05-17 | Discharge: 2022-05-17 | Disposition: A | Discharge status: Home / Self Care | Payer: No Typology Code available for payment source | Source: Ambulatory Visit | Attending: Surgery | Admitting: Surgery

## 2022-05-17 DIAGNOSIS — E041 Nontoxic single thyroid nodule: Secondary | ICD-10-CM | POA: Insufficient documentation

## 2022-05-17 MED ORDER — LIDOCAINE HCL (PF) 1 % IJ SOLN
INTRAMUSCULAR | Status: AC
  Filled 2022-05-17: qty 30

## 2022-05-19 LAB — CYTOLOGY - NON PAP

## 2022-09-20 ENCOUNTER — Ambulatory Visit (HOSPITAL_COMMUNITY): Payer: Medicare HMO

## 2022-11-14 ENCOUNTER — Other Ambulatory Visit: Payer: Self-pay

## 2022-11-14 ENCOUNTER — Emergency Department (HOSPITAL_COMMUNITY)
Admission: EM | Admit: 2022-11-14 | Discharge: 2022-11-14 | Disposition: A | Discharge status: Home / Self Care | Payer: No Typology Code available for payment source | Attending: Emergency Medicine | Admitting: Emergency Medicine

## 2022-11-14 ENCOUNTER — Encounter (HOSPITAL_COMMUNITY): Payer: Self-pay

## 2022-11-14 DIAGNOSIS — Y92007 Garden or yard of unspecified non-institutional (private) residence as the place of occurrence of the external cause: Secondary | ICD-10-CM | POA: Diagnosis not present

## 2022-11-14 DIAGNOSIS — X58XXXA Exposure to other specified factors, initial encounter: Secondary | ICD-10-CM | POA: Insufficient documentation

## 2022-11-14 DIAGNOSIS — S0501XA Injury of conjunctiva and corneal abrasion without foreign body, right eye, initial encounter: Secondary | ICD-10-CM | POA: Diagnosis present

## 2022-11-14 DIAGNOSIS — H1131 Conjunctival hemorrhage, right eye: Secondary | ICD-10-CM | POA: Diagnosis not present

## 2022-11-14 MED ORDER — POLYMYXIN B-TRIMETHOPRIM 10000-0.1 UNIT/ML-% OP SOLN: 1.0000 [drp] | OPHTHALMIC | 0 refills | Status: DC

## 2022-11-14 MED ORDER — TETRACAINE HCL 0.5 % OP SOLN
1.0000 [drp] | Freq: Once | OPHTHALMIC | Status: AC
  Administered 2022-11-14: 1 [drp] via OPHTHALMIC
  Filled 2022-11-14: qty 4

## 2022-11-14 MED ORDER — FLUORESCEIN SODIUM 1 MG OP STRP
1.0000 | ORAL_STRIP | Freq: Once | OPHTHALMIC | Status: AC
  Administered 2022-11-14: 1 via OPHTHALMIC
  Filled 2022-11-14: qty 1

## 2022-11-14 NOTE — Discharge Instructions (Addendum)
Use the eyedrops as prescribed.  Please do not rub your eye.  Warm compresses.  I have given you ophthalmology follow-up if you like.

## 2022-11-14 NOTE — ED Triage Notes (Signed)
Right eye pain and redness after doing yard work.   Suspects a brier or piece of grass is still in eye.   Denies any visual loss.

## 2022-11-14 NOTE — ED Provider Notes (Signed)
[facility] Provider Note   CSN: [id number] Arrival date & time: 11/14/22  1854     History  Chief Complaint  Patient presents with   Eye Pain    Gerald Whitaker is a 77 y.o. male.  77 yo M with a chief complaints of right eye pain.  He was doing some yard work and at some point he ended up inadvertently poking himself in the eye.  He thinks it was with some brush.  Since then he feels like something is been in there and has been rubbing it to try and get it out.  Still having symptoms and so thinks that something must be in there.  Came here for evaluation.  Feels like his tetanus shot is likely up-to-date.   Eye Pain       Home Medications Prior to Admission medications   Medication Sig Start Date End Date Taking? Authorizing Provider  trimethoprim-polymyxin b (POLYTRIM) ophthalmic solution Place 1 drop into the right eye every 4 (four) hours. 11/14/22  Yes Deno Etienne, DO  Multiple Vitamin (MULTIVITAMIN WITH MINERALS) TABS tablet Take 1 tablet by mouth daily.    [provider]  Nutritional Supplements (GRAPESEED EXTRACT PO) Take 2 capsules by mouth daily.    [provider]  OVER THE COUNTER MEDICATION Take 3 tablets by mouth daily. Life extension multi-nutrient formula    [provider]  OVER THE COUNTER MEDICATION Take 2 tablets by mouth daily. Stem cell restore    [provider]  OVER THE COUNTER MEDICATION Take 3 tablets by mouth daily. Mens Vitality    [provider]  OVER THE COUNTER MEDICATION Take 1 Scoop by mouth daily. Super Red supplement powder    [provider]  OVER THE COUNTER MEDICATION Take 1 Scoop by mouth daily. Super Greens supplement powder    [provider]  OVER THE COUNTER MEDICATION Apply 1 patch topically daily. Super Patch    [provider]  Protein POWD Take 1 Scoop by mouth once a week.    [provider]       Allergies    Aspirin and Other    Review of Systems   Review of Systems  Eyes:  Positive for pain.    Physical Exam Updated Vital Signs BP (!) 143/50 (BP Location: Right Arm)   Pulse 63   Temp 98.1 F (36.7 C) (Oral)   Resp 15   Ht 6' (1.829 m)   Wt 106.6 kg   SpO2 98%   BMI 31.87 kg/m  Physical Exam Vitals and nursing note reviewed.  Constitutional:      Appearance: He is well-developed.  HENT:     Head: Normocephalic and atraumatic.  Eyes:     Pupils: Pupils are equal, round, and reactive to light.     Comments: Subconjunctival hematoma to the right eye.  He has a small corneal abrasion right at the lower aspect of the iris.  Pupils equal round and reactive.  Negative Seidel's.  Neck:     Vascular: No JVD.  Cardiovascular:     Rate and Rhythm: Normal rate and regular rhythm.     Heart sounds: No murmur heard.    No friction rub. No gallop.  Pulmonary:     Effort: No respiratory distress.     Breath sounds: No wheezing.  Abdominal:     General: There is no distension.     Tenderness: There is no abdominal  tenderness. There is no guarding or rebound.  Musculoskeletal:        General: Normal range of motion.     Cervical back: Normal range of motion and neck supple.  Skin:    Coloration: Skin is not pale.     Findings: No rash.  Neurological:     Mental Status: He is alert and oriented to person, place, and time.  Psychiatric:        Behavior: Behavior normal.     ED Results / Procedures / Treatments   Labs (all labs ordered are listed, but only abnormal results are displayed) Labs Reviewed - No data to display  EKG None  Radiology No results found.  Procedures Procedures    Medications Ordered in ED Medications  fluorescein ophthalmic strip 1 strip (1 strip Right Eye Given 11/14/22 2046)  tetracaine (PONTOCAINE) 0.5 % ophthalmic solution 1 drop (1 drop Right Eye Given 11/14/22 2045)    ED Course/ Medical Decision Making/ A&P                              Medical Decision Making Risk Prescription drug management.   77 yo M with a chief complaints of right eye pain.  He was doing some yard work today and thinks that he hit his eye inadvertently with some brush.  Since then he has been concerned that something is been in it.  Has been rubbing it vigorously.  He has a corneal abrasion.  Symptoms improved completely with tetracaine.  Will have him follow-up with ophthalmology.  Antibiotic eyedrops.  Does not wear contact lenses.  Tdap reportedly up-to-date.  9:24 PM:  I have discussed the diagnosis/risks/treatment options with the patient.  Evaluation and diagnostic testing in the emergency department does not suggest an emergent condition requiring admission or immediate intervention beyond what has been performed at this time.  They will follow up with Ophtho. We also discussed returning to the ED immediately if new or worsening sx occur. We discussed the sx which are most concerning (e.g., sudden worsening pain, fever, inability to tolerate by mouth, vision change) that necessitate immediate return. Medications administered to the patient during their visit and any new prescriptions provided to the patient are listed below.  Medications given during this visit Medications  fluorescein ophthalmic strip 1 strip (1 strip Right Eye Given 11/14/22 2046)  tetracaine (PONTOCAINE) 0.5 % ophthalmic solution 1 drop (1 drop Right Eye Given 11/14/22 2045)     The patient appears reasonably screen and/or stabilized for discharge and I doubt any other medical condition or other [facility] requiring further screening, evaluation, or treatment in the ED at this time prior to discharge.          Final Clinical Impression(s) / ED Diagnoses Final diagnoses:  Abrasion of right cornea, initial encounter  Subconjunctival hemorrhage of right eye    Rx / DC Orders ED Discharge Orders          Ordered    trimethoprim-polymyxin b (POLYTRIM) ophthalmic  solution  Every 4 hours        11/14/22 2121              Deno Etienne, DO 11/14/22 2124

## 2022-11-15 DIAGNOSIS — H1131 Conjunctival hemorrhage, right eye: Secondary | ICD-10-CM | POA: Diagnosis not present

## 2022-12-27 DIAGNOSIS — Z1212 Encounter for screening for malignant neoplasm of rectum: Secondary | ICD-10-CM | POA: Diagnosis not present

## 2022-12-30 DIAGNOSIS — R7989 Other specified abnormal findings of blood chemistry: Secondary | ICD-10-CM | POA: Diagnosis not present

## 2022-12-30 DIAGNOSIS — D696 Thrombocytopenia, unspecified: Secondary | ICD-10-CM | POA: Diagnosis not present

## 2022-12-30 DIAGNOSIS — Z125 Encounter for screening for malignant neoplasm of prostate: Secondary | ICD-10-CM | POA: Diagnosis not present

## 2023-01-03 DIAGNOSIS — D696 Thrombocytopenia, unspecified: Secondary | ICD-10-CM | POA: Diagnosis not present

## 2023-01-03 DIAGNOSIS — Z96642 Presence of left artificial hip joint: Secondary | ICD-10-CM | POA: Diagnosis not present

## 2023-01-03 DIAGNOSIS — K30 Functional dyspepsia: Secondary | ICD-10-CM | POA: Diagnosis not present

## 2023-01-03 DIAGNOSIS — Z1331 Encounter for screening for depression: Secondary | ICD-10-CM | POA: Diagnosis not present

## 2023-01-03 DIAGNOSIS — Z0000 Encounter for general adult medical examination without abnormal findings: Secondary | ICD-10-CM | POA: Diagnosis not present

## 2023-01-03 DIAGNOSIS — Z1339 Encounter for screening examination for other mental health and behavioral disorders: Secondary | ICD-10-CM | POA: Diagnosis not present

## 2023-01-03 DIAGNOSIS — N401 Enlarged prostate with lower urinary tract symptoms: Secondary | ICD-10-CM | POA: Diagnosis not present

## 2023-01-03 DIAGNOSIS — R82998 Other abnormal findings in urine: Secondary | ICD-10-CM | POA: Diagnosis not present

## 2023-01-03 DIAGNOSIS — R972 Elevated prostate specific antigen [PSA]: Secondary | ICD-10-CM | POA: Diagnosis not present

## 2023-05-31 DIAGNOSIS — R32 Unspecified urinary incontinence: Secondary | ICD-10-CM | POA: Diagnosis not present

## 2023-05-31 DIAGNOSIS — I10 Essential (primary) hypertension: Secondary | ICD-10-CM | POA: Diagnosis not present

## 2023-05-31 DIAGNOSIS — Z96641 Presence of right artificial hip joint: Secondary | ICD-10-CM | POA: Diagnosis not present

## 2023-05-31 DIAGNOSIS — Z8249 Family history of ischemic heart disease and other diseases of the circulatory system: Secondary | ICD-10-CM | POA: Diagnosis not present

## 2023-05-31 DIAGNOSIS — Z823 Family history of stroke: Secondary | ICD-10-CM | POA: Diagnosis not present

## 2023-05-31 DIAGNOSIS — D696 Thrombocytopenia, unspecified: Secondary | ICD-10-CM | POA: Diagnosis not present

## 2023-05-31 DIAGNOSIS — Z87891 Personal history of nicotine dependence: Secondary | ICD-10-CM | POA: Diagnosis not present

## 2023-05-31 DIAGNOSIS — Z8042 Family history of malignant neoplasm of prostate: Secondary | ICD-10-CM | POA: Diagnosis not present

## 2023-05-31 DIAGNOSIS — Z801 Family history of malignant neoplasm of trachea, bronchus and lung: Secondary | ICD-10-CM | POA: Diagnosis not present

## 2023-06-29 ENCOUNTER — Emergency Department (HOSPITAL_COMMUNITY): Payer: No Typology Code available for payment source

## 2023-06-29 ENCOUNTER — Other Ambulatory Visit: Payer: Self-pay

## 2023-06-29 ENCOUNTER — Observation Stay (HOSPITAL_COMMUNITY)
Admission: EM | Admit: 2023-06-29 | Discharge: 2023-06-30 | Disposition: A | Discharge status: Home / Self Care | Payer: No Typology Code available for payment source | Attending: Internal Medicine | Admitting: Internal Medicine

## 2023-06-29 ENCOUNTER — Observation Stay (HOSPITAL_COMMUNITY): Payer: No Typology Code available for payment source

## 2023-06-29 ENCOUNTER — Encounter (HOSPITAL_COMMUNITY): Payer: Self-pay

## 2023-06-29 DIAGNOSIS — E041 Nontoxic single thyroid nodule: Secondary | ICD-10-CM | POA: Diagnosis not present

## 2023-06-29 DIAGNOSIS — E785 Hyperlipidemia, unspecified: Secondary | ICD-10-CM | POA: Insufficient documentation

## 2023-06-29 DIAGNOSIS — I639 Cerebral infarction, unspecified: Principal | ICD-10-CM | POA: Diagnosis present

## 2023-06-29 DIAGNOSIS — D72819 Decreased white blood cell count, unspecified: Secondary | ICD-10-CM | POA: Insufficient documentation

## 2023-06-29 DIAGNOSIS — D696 Thrombocytopenia, unspecified: Secondary | ICD-10-CM | POA: Insufficient documentation

## 2023-06-29 DIAGNOSIS — R001 Bradycardia, unspecified: Secondary | ICD-10-CM | POA: Diagnosis not present

## 2023-06-29 DIAGNOSIS — I6389 Other cerebral infarction: Principal | ICD-10-CM | POA: Insufficient documentation

## 2023-06-29 DIAGNOSIS — Z96643 Presence of artificial hip joint, bilateral: Secondary | ICD-10-CM | POA: Diagnosis not present

## 2023-06-29 DIAGNOSIS — R531 Weakness: Secondary | ICD-10-CM | POA: Diagnosis present

## 2023-06-29 LAB — BASIC METABOLIC PANEL
Anion gap: 6 (ref 5–15)
BUN: 11 mg/dL (ref 8–23)
CO2: 25 mmol/L (ref 22–32)
Calcium: 9.5 mg/dL (ref 8.9–10.3)
Chloride: 107 mmol/L (ref 98–111)
Creatinine, Ser: 1.15 mg/dL (ref 0.61–1.24)
GFR, Estimated: >60 mL/min (ref >60)
Glucose, Bld: 100 mg/dL — ABNORMAL HIGH (ref 70–99)
Potassium: 4.3 mmol/L (ref 3.5–5.1)
Sodium: 138 mmol/L (ref 135–145)

## 2023-06-29 LAB — URINALYSIS, ROUTINE W REFLEX MICROSCOPIC
Bilirubin Urine: NEGATIVE
Glucose, UA: NEGATIVE mg/dL
Hgb urine dipstick: NEGATIVE
Ketones, ur: NEGATIVE mg/dL
Leukocytes,Ua: NEGATIVE
Nitrite: NEGATIVE
Protein, ur: NEGATIVE mg/dL
Specific Gravity, Urine: 1.005 (ref 1.005–1.030)
pH: 7 (ref 5.0–8.0)

## 2023-06-29 LAB — CBC
HCT: 41.2 % (ref 39.0–52.0)
Hemoglobin: 13.5 g/dL (ref 13.0–17.0)
MCH: 30.2 pg (ref 26.0–34.0)
MCHC: 32.8 g/dL (ref 30.0–36.0)
MCV: 92.2 fL (ref 80.0–100.0)
Platelets: 131 10*3/uL — ABNORMAL LOW (ref 150–400)
RBC: 4.47 MIL/uL (ref 4.22–5.81)
RDW: 13.9 % (ref 11.5–15.5)
WBC: 3.7 10*3/uL — ABNORMAL LOW (ref 4.0–10.5)
nRBC: 0 % (ref 0.0–0.2)

## 2023-06-29 MED ORDER — ASPIRIN 81 MG PO TBEC
81.0000 mg | DELAYED_RELEASE_TABLET | Freq: Every day | ORAL | Status: DC
  Administered 2023-06-29 – 2023-06-30 (×2): 81 mg via ORAL
  Filled 2023-06-29 (×2): qty 1

## 2023-06-29 MED ORDER — MELATONIN 5 MG PO TABS: 5.0000 mg | ORAL_TABLET | Freq: Every evening | ORAL | Status: DC | PRN

## 2023-06-29 MED ORDER — CLOPIDOGREL BISULFATE 75 MG PO TABS
75.0000 mg | ORAL_TABLET | Freq: Every day | ORAL | Status: DC
  Administered 2023-06-29 – 2023-06-30 (×2): 75 mg via ORAL
  Filled 2023-06-29 (×2): qty 1

## 2023-06-29 MED ORDER — ACETAMINOPHEN 325 MG PO TABS: 650.0000 mg | ORAL_TABLET | Freq: Four times a day (QID) | ORAL | Status: DC | PRN

## 2023-06-29 MED ORDER — LABETALOL HCL 5 MG/ML IV SOLN: 5.0000 mg | INTRAVENOUS | Status: DC | PRN

## 2023-06-29 MED ORDER — IOHEXOL 350 MG/ML SOLN
75.0000 mL | Freq: Once | INTRAVENOUS | Status: AC | PRN
  Administered 2023-06-29: 75 mL via INTRAVENOUS

## 2023-06-29 MED ORDER — ATORVASTATIN CALCIUM 80 MG PO TABS
80.0000 mg | ORAL_TABLET | Freq: Every day | ORAL | Status: DC
  Administered 2023-06-29 – 2023-06-30 (×2): 80 mg via ORAL
  Filled 2023-06-29: qty 1
  Filled 2023-06-29: qty 2

## 2023-06-29 MED ORDER — PROCHLORPERAZINE EDISYLATE 10 MG/2ML IJ SOLN: 5.0000 mg | Freq: Four times a day (QID) | INTRAMUSCULAR | Status: DC | PRN

## 2023-06-29 MED ORDER — POLYETHYLENE GLYCOL 3350 17 G PO PACK: 17.0000 g | PACK | Freq: Every day | ORAL | Status: DC | PRN

## 2023-06-29 NOTE — H&P (Signed)
History and Physical  Gerald Whitaker [id number] DOB: Jun 18, 1946 DOA: 06/29/2023  Referring physician: Dr. Suezanne Jacquet, EDP  PCP: [facility]  Outpatient Specialists: None Patient coming from: Home  Chief Complaint: Balance issues.  HPI: Gerald Whitaker is a 77 y.o. male with no significant past medical history who presents to the ED from home due to bilateral lower extremity weakness and sensation of falling over onto the right side.  Associated with mild dizziness.  Symptoms started yesterday.  The patient denies any falls.  While in the ED his symptoms initially persisted then went away.  Noncontrast brain MRI revealed acute infarct in the left hemipons with no hemorrhage.  EDP discussed the case with neurology who recommended admission for stroke workup.  Admitted by [facility], hospitalist service.  ED Course: Temperature 97.8.  BP 158/83, pulse 50, respiratory rate 12, saturation 100% on room air.  Lab studies essentially unremarkable except for WBC 3.7, platelet count 131.  Serum glucose 100.  UA negative for pyuria.  Review of Systems: Review of systems as noted in the HPI. All other systems reviewed and are negative.   Past Medical History:  Diagnosis Date   Arthritis    Past Surgical History:  Procedure Laterality Date   TOTAL HIP ARTHROPLASTY Left 09/30/2019   Procedure: LEFT TOTAL HIP ARTHROPLASTY ANTERIOR APPROACH;  Surgeon: Tarry Kos, MD;  Location: [facility] OR;  Service: Orthopedics;  Laterality: Left;   TOTAL HIP ARTHROPLASTY Right 12/02/2019   Procedure: RIGHT TOTAL HIP ARTHROPLASTY ANTERIOR APPROACH;  Surgeon: Tarry Kos, MD;  Location: [facility] OR;  Service: Orthopedics;  Laterality: Right;    Social History:  reports that he quit smoking about 40 years ago. His smoking use included cigarettes. He has never used smokeless tobacco. He reports that he does not currently use alcohol. He reports that he does not use drugs.   Allergies  Allergen Reactions   Other Other  (See Comments)    NO BLOOD PRODUCTS   Aspirin Nausea And Vomiting    GI Intolerance    Family history: None reported.  Prior to Admission medications   Medication Sig Start Date End Date Taking? Authorizing Provider  Multiple Vitamin (MULTIVITAMIN WITH MINERALS) TABS tablet Take 2 tablets by mouth daily.   Yes [provider]  Nutritional Supplements (GRAPESEED EXTRACT PO) Take 1 capsule by mouth in the morning, at noon, and at bedtime. [place] seed/skin capsule   Yes [provider]  OVER THE COUNTER MEDICATION Take 1 Scoop by mouth See admin instructions. Super Red supplement powder, patient uses weekly   Yes [provider]  OVER THE COUNTER MEDICATION Take 1 Scoop by mouth See admin instructions. Super Greens supplement powder, patient uses weekly   Yes [provider]  OVER THE COUNTER MEDICATION Take 3 capsules by mouth daily. Enhanced Oral Chelation II   Yes [provider]  OVER THE COUNTER MEDICATION Take 4 sprays by mouth daily. Gluta-Myst   Yes [provider]  OVER THE COUNTER MEDICATION Take 4 sprays by mouth daily. Xantho-Myst Plus   Yes [provider]  OVER THE COUNTER MEDICATION Take 1 Scoop by mouth 2 (two) times daily. Gaditana Original Formula   Yes [provider]  OVER THE COUNTER MEDICATION Take 1 Scoop by mouth daily as needed (immune support). Colloidal Silver Supplemental Powder. Mix in shake and drink once daily and as needed for immune support   Yes [provider]    Physical Exam: BP (!) 140/70 (  BP Location: Right Arm)   Pulse (!) 42   Temp 97.8 F (36.6 C)   Resp 11   Ht 6' (1.829 m)   Wt 93 kg   SpO2 100%   BMI 27.80 kg/m   General: 77 y.o. year-old male well developed well nourished in no acute distress.  Alert and oriented x3. Cardiovascular: Regular rate and rhythm with no rubs or gallops.  No thyromegaly or JVD noted.  No lower extremity edema. 2/4 pulses in all 4  extremities. Respiratory: Clear to auscultation with no wheezes or rales. Good inspiratory effort. Abdomen: Soft nontender nondistended with normal bowel sounds x4 quadrants. Muskuloskeletal: No cyanosis, clubbing or edema noted bilaterally Neuro: CN II-XII intact, strength, sensation, reflexes Skin: No ulcerative lesions noted or rashes Psychiatry: Judgement and insight appear normal. Mood is appropriate for condition and setting          Labs on Admission:  Basic Metabolic Panel: Recent Labs  Lab 06/29/23 1422  NA 138  K 4.3  CL 107  CO2 25  GLUCOSE 100*  BUN 11  CREATININE 1.15  CALCIUM 9.5   Liver Function Tests: No results for input(s): "AST", "ALT", "ALKPHOS", "BILITOT", "PROT", "ALBUMIN" in the last 168 hours. No results for input(s): "LIPASE", "AMYLASE" in the last 168 hours. No results for input(s): "AMMONIA" in the last 168 hours. CBC: Recent Labs  Lab 06/29/23 1422  WBC 3.7*  HGB 13.5  HCT 41.2  MCV 92.2  PLT 131*   Cardiac Enzymes: No results for input(s): "CKTOTAL", "CKMB", "CKMBINDEX", "TROPONINI" in the last 168 hours.  BNP (last 3 results) No results for input(s): "BNP" in the last 8760 hours.  ProBNP (last 3 results) No results for input(s): "PROBNP" in the last 8760 hours.  CBG: No results for input(s): "GLUCAP" in the last 168 hours.  Radiological Exams on Admission: MR Brain Wo Contrast (neuro protocol)  Result Date: 06/29/2023 CLINICAL DATA:  Neuro deficit, acute, stroke suspected EXAM: MRI HEAD WITHOUT CONTRAST TECHNIQUE: Multiplanar, multiecho pulse sequences of the brain and surrounding structures were obtained without intravenous contrast. COMPARISON:  None Available. FINDINGS: Brain: Acute infarct in the left hemi pons. No hemorrhage. No hydrocephalus. No extra-axial fluid collection. No mass effect. No mass lesion. Chronic infarcts in the left parietal lobe. Vascular: Normal flow voids. Skull and upper cervical spine: Normal marrow  signal. Sinuses/Orbits: No middle ear or mastoid effusion. Paranasal sinuses are clear. Orbits are unremarkable. Other: None. IMPRESSION: Acute infarct in the left hemi pons. No hemorrhage. Electronically Signed   By: Lorenza Cambridge M.D.   On: 06/29/2023 20:21    EKG: I independently viewed the EKG done and my findings are as followed: Sinus bradycardia rate of 49.  Nonspecific ST-T changes.  QTc 388.  Assessment/Plan Present on Admission:  CVA (cerebral vascular accident) (HCC)  Principal Problem:   CVA (cerebral vascular accident) (HCC)  Acute infarct in the left hemipons with no hemorrhage, seen on MRI brain Admitted for full stroke workup Neurology following Aspirin and Plavix x 21 days, then antiplatelet monotherapy High intensity statin, Lipitor 80 mg daily Permissive hypertension in the next 48 hours Treat SBP greater than 220 or DBP greater than 120 Follow CTA head and neck, 2D echo, fasting lipid panel, A1c Frequent neurochecks every 4 hours PT/OT/speech therapist assessment Fall precautions  Intermittent sinus bradycardia Initial admission 12 lead EKG showing sinus bradycardia rate of 49 Rate is now improved to 65. Monitor on telemetry  Leukopenia/thrombocytopenia WBC 3.7, platelet count 131 Monitor  for now   Time: 75 minutes.   DVT prophylaxis: Subcu Lovenox daily  Code Status: Full code  Family Communication: Updated his son at bedside.  Disposition Plan: Admitted to telemetry medical unit  Consults called: Neurology consulted by EDP  Admission status: Observation status.   Status is: Observation    Darlin Drop MD [facility] Pager [phone]  If 7PM-7AM, please contact night-coverage [email or web address] Password [email or web address]  06/29/2023, 9:06 PM

## 2023-06-29 NOTE — ED Notes (Signed)
ED TO INPATIENT HANDOFF REPORT  ED Nurse Name and Phone #:  Theadora Rama RN [phone]  S Name/Age/Gender Gerald Whitaker 77 y.o. male Room/Bed: 006C/006C  Code Status   Code Status: Prior  Home/SNF/Other Home Patient oriented to: self, place, time, and situation Is this baseline? Yes   Triage Complete: Triage complete  Chief Complaint CVA (cerebral vascular accident) [facility]) [I63.9]  Triage Note Pt came to ED for bilateral leg weakness that started yesterday morning. Axox4. Pt walked to triage room without any issues. Pt denies falling.   Allergies Allergies  Allergen Reactions   Other Other (See Comments)    NO BLOOD PRODUCTS   Aspirin Nausea And Vomiting    GI Intolerance    Level of Care/Admitting Diagnosis ED Disposition     ED Disposition  Admit   Condition  --   Comment  Hospital Area: [facility] [[id number]]  Level of Care: Telemetry Medical [104]  May place patient in observation at [facility] or [facility] if equivalent level of care is available:: No  Covid Evaluation: Asymptomatic - no recent exposure (last 10 days) testing not required  Diagnosis: CVA (cerebral vascular accident) [facility]) [[id number]]  Admitting Physician: Darlin Drop [[id number]]  Attending Physician: Darlin Drop [[id number]]          B Medical/Surgery History Past Medical History:  Diagnosis Date   Arthritis    Past Surgical History:  Procedure Laterality Date   TOTAL HIP ARTHROPLASTY Left 09/30/2019   Procedure: LEFT TOTAL HIP ARTHROPLASTY ANTERIOR APPROACH;  Surgeon: Tarry Kos, MD;  Location: [facility] OR;  Service: Orthopedics;  Laterality: Left;   TOTAL HIP ARTHROPLASTY Right 12/02/2019   Procedure: RIGHT TOTAL HIP ARTHROPLASTY ANTERIOR APPROACH;  Surgeon: Tarry Kos, MD;  Location: [facility] OR;  Service: Orthopedics;  Laterality: Right;     A IV Location/Drains/Wounds Patient Lines/Drains/Airways Status     Active Line/Drains/Airways     Name Placement date  Placement time Site Days   Peripheral IV 06/29/23 20 G Right Antecubital 06/29/23  2044  Antecubital  less than 1            Intake/Output Last 24 hours No intake or output data in the 24 hours ending 06/29/23 2114  Labs/Imaging Results for orders placed or performed during the hospital encounter of 06/29/23 (from the past 48 hour(s))  Urinalysis, Routine w reflex microscopic -Urine, Clean Catch     Status: None   Collection Time: 06/29/23  2:02 PM  Result Value Ref Range   Color, Urine YELLOW YELLOW   APPearance CLEAR CLEAR   Specific Gravity, Urine 1.005 1.005 - 1.030   pH 7.0 5.0 - 8.0   Glucose, UA NEGATIVE NEGATIVE mg/dL   Hgb urine dipstick NEGATIVE NEGATIVE   Bilirubin Urine NEGATIVE NEGATIVE   Ketones, ur NEGATIVE NEGATIVE mg/dL   Protein, ur NEGATIVE NEGATIVE mg/dL   Nitrite NEGATIVE NEGATIVE   Leukocytes,Ua NEGATIVE NEGATIVE    Comment: Performed at [facility], [address] [place], [place] [address]  Basic metabolic panel     Status: Abnormal   Collection Time: 06/29/23  2:22 PM  Result Value Ref Range   Sodium 138 135 - 145 mmol/L   Potassium 4.3 3.5 - 5.1 mmol/L   Chloride 107 98 - 111 mmol/L   CO2 25 22 - 32 mmol/L   Glucose, Bld 100 (H) 70 - 99 mg/dL    Comment: Glucose reference range applies only to samples taken after  fasting for at least 8 hours.   BUN 11 8 - 23 mg/dL   Creatinine, Ser 1.61 0.61 - 1.24 mg/dL   Calcium 9.5 8.9 - 09.6 mg/dL   GFR, Estimated >04 >54 mL/min    Comment: (NOTE) Calculated using the CKD-EPI Creatinine Equation (2021)    Anion gap 6 5 - 15    Comment: Performed at [facility], [address] [place], [place] [address]  CBC     Status: Abnormal   Collection Time: 06/29/23  2:22 PM  Result Value Ref Range   WBC 3.7 (L) 4.0 - 10.5 K/uL   RBC 4.47 4.22 - 5.81 MIL/uL   Hemoglobin 13.5 13.0 - 17.0 g/dL   HCT 91.4 78.2 - 95.6 %   MCV 92.2 80.0 - 100.0 fL   MCH 30.2 26.0 - 34.0 pg   MCHC 32.8 30.0 - 36.0  g/dL   RDW 21.3 08.6 - 57.8 %   Platelets 131 (L) 150 - 400 K/uL    Comment: REPEATED TO VERIFY   nRBC 0.0 0.0 - 0.2 %    Comment: Performed at [facility], [address] [place], [place] [address]   MR Brain Wo Contrast (neuro protocol)  Result Date: 06/29/2023 CLINICAL DATA:  Neuro deficit, acute, stroke suspected EXAM: MRI HEAD WITHOUT CONTRAST TECHNIQUE: Multiplanar, multiecho pulse sequences of the brain and surrounding structures were obtained without intravenous contrast. COMPARISON:  None Available. FINDINGS: Brain: Acute infarct in the left hemi pons. No hemorrhage. No hydrocephalus. No extra-axial fluid collection. No mass effect. No mass lesion. Chronic infarcts in the left parietal lobe. Vascular: Normal flow voids. Skull and upper cervical spine: Normal marrow signal. Sinuses/Orbits: No middle ear or mastoid effusion. Paranasal sinuses are clear. Orbits are unremarkable. Other: None. IMPRESSION: Acute infarct in the left hemi pons. No hemorrhage. Electronically Signed   By: Lorenza Cambridge M.D.   On: 06/29/2023 20:21    Pending Labs Unresulted Labs (From admission, onward)     Start     Ordered   06/30/23 0500  Lipid panel  Tomorrow morning,   R        06/29/23 2100   06/30/23 0500  CBC  Tomorrow morning,   R        06/29/23 2100   06/30/23 0500  Basic metabolic panel  Tomorrow morning,   R        06/29/23 2100   06/30/23 0500  Magnesium  Tomorrow morning,   R        06/29/23 2100   06/30/23 0500  Phosphorus  Tomorrow morning,   R        06/29/23 2100   06/30/23 0500  Hemoglobin A1c  Tomorrow morning,   R        06/29/23 2106            Vitals/Pain Today's Vitals   06/29/23 1724 06/29/23 1800 06/29/23 1824 06/29/23 2100  BP: (!) 140/70   (!) 158/83  Pulse: (!) 47 (!) 42  60  Resp: 16 11  16   Temp: 97.8 F (36.6 C)     TempSrc:      SpO2: 100% 100%  100%  Weight:      Height:      PainSc:   0-No pain     Isolation Precautions No active  isolations  Medications Medications  clopidogrel (PLAVIX) tablet 75 mg (has no administration in time range)  labetalol (NORMODYNE) injection 5 mg (has no administration  in time range)  acetaminophen (TYLENOL) tablet 650 mg (has no administration in time range)  prochlorperazine (COMPAZINE) injection 5 mg (has no administration in time range)  melatonin tablet 5 mg (has no administration in time range)  polyethylene glycol (MIRALAX / GLYCOLAX) packet 17 g (has no administration in time range)  aspirin EC tablet 81 mg (has no administration in time range)  atorvastatin (LIPITOR) tablet 80 mg (has no administration in time range)    Mobility walks     Focused Assessments Cardiac Assessment Handoff:  Cardiac Rhythm: Sinus bradycardia, Normal sinus rhythm No results found for: "CKTOTAL", "CKMB", "CKMBINDEX", "TROPONINI" No results found for: "DDIMER" Does the Patient currently have chest pain? No   , Neuro Assessment Handoff:  Swallow screen pass? Yes  Cardiac Rhythm: Sinus bradycardia, Normal sinus rhythm NIH Stroke Scale  Dizziness Present: Yes Headache Present: No Interval: Initial Level of Consciousness (1a.)   : Alert, keenly responsive LOC Questions (1b. )   : Answers both questions correctly LOC Commands (1c. )   : Performs both tasks correctly Best Gaze (2. )  : Normal Visual (3. )  : No visual loss Facial Palsy (4. )    : Normal symmetrical movements Motor Arm, Left (5a. )   : No drift Motor Arm, Right (5b. ) : No drift Motor Leg, Left (6a. )  : No drift Motor Leg, Right (6b. ) : No drift Limb Ataxia (7. ): Absent Sensory (8. )  : Normal, no sensory loss Best Language (9. )  : No aphasia Dysarthria (10. ): Normal Extinction/Inattention (11.)   : No Abnormality Complete NIHSS TOTAL: 0     Neuro Assessment: Within Defined Limits Neuro Checks:   Initial (06/29/23 1821)  Has TPA been given? No If patient is a Neuro Trauma and patient is going to OR before  floor call report to 4N Charge nurse: [phone] or [phone]  , Pulmonary Assessment Handoff:  Lung sounds:   O2 Device: Room Air      R Recommendations: See Admitting Provider Note  Report given to:   Additional Notes:

## 2023-06-29 NOTE — ED Triage Notes (Addendum)
Pt came to ED for bilateral leg weakness that started yesterday morning. Axox4. Pt walked to triage room without any issues. Pt denies falling.

## 2023-06-29 NOTE — ED Provider Notes (Signed)
[place] [facility]ERGENC[facility] DEPARTMENT AT [facility] Provider Note  CSN: [id number] Arrival date & time: 06/29/23 1339  Chief Complaint(s) Extremity Weakness  HPI ODAS OZER is a 77 y.o. male presenting with balance issues.  Patient reports yesterday until today he was having issues with falling over onto the right side.  He stood up in the exam room and seems that this is resolved.  Symptoms occur when standing and walking.  Does not happen at rest.  Not associated with head movement.  Possibly a little bit of vertigo or drunk sensation.  He reported some chronic right arm and right foot numbness which she reports is old from some prior nerve injury.  No new tingling.  He reports that he feels that both of his legs are weak.  No headaches.  No speech changes or vision abnormality.  No chest or abdominal pain.  No back pain.  No fevers or chills.  Denies similar episodes.  Called the [place] who had him come over here for evaluation.   Past Medical History Past Medical History:  Diagnosis Date   Arthritis    Patient Active Problem List   Diagnosis Date Noted   CVA (cerebral vascular accident) (HCC) 06/29/2023   Status post total replacement of right hip 12/02/2019   Status post total replacement of left hip 09/30/2019   Primary osteoarthritis of right hip 08/23/2019   Home Medication(s) Prior to Admission medications   Medication Sig Start Date End Date Taking? Authorizing Provider  Multiple Vitamin (MULTIVITAMIN WITH MINERALS) TABS tablet Take 2 tablets by mouth daily.   Yes [provider]  Nutritional Supplements (GRAPESEED EXTRACT PO) Take 1 capsule by mouth in the morning, at noon, and at bedtime. [place]cadine seed/skin capsule   Yes [provider]  OVER THE COUNTER MEDICATION Take 1 Scoop by mouth See admin instructions. Super Red supplement powder, patient uses weekly   Yes [provider]  OVER THE COUNTER MEDICATION Take 1 Scoop by mouth See admin  instructions. Super Greens supplement powder, patient uses weekly   Yes [provider]  OVER THE COUNTER MEDICATION Take 3 capsules by mouth daily. Enhanced Oral Chelation II   Yes [provider]  OVER THE COUNTER MEDICATION Take 4 sprays by mouth daily. Gluta-Myst   Yes [provider]  OVER THE COUNTER MEDICATION Take 4 sprays by mouth daily. Xantho-Myst Plus   Yes [provider]  OVER THE COUNTER MEDICATION Take 1 Scoop by mouth 2 (two) times daily. Gaditana Original Formula   Yes [provider]  OVER THE COUNTER MEDICATION Take 1 Scoop by mouth daily as needed (immune support). Colloidal Silver Supplemental Powder. Mix in shake and drink once daily and as needed for immune support   Yes [provider]  Past Surgical History Past Surgical History:  Procedure Laterality Date   TOTAL HIP ARTHROPLASTY Left 09/30/2019   Procedure: LEFT TOTAL HIP ARTHROPLASTY ANTERIOR APPROACH;  Surgeon: Tarry Kos, MD;  Location: [facility] OR;  Service: Orthopedics;  Laterality: Left;   TOTAL HIP ARTHROPLASTY Right 12/02/2019   Procedure: RIGHT TOTAL HIP ARTHROPLASTY ANTERIOR APPROACH;  Surgeon: Tarry Kos, MD;  Location: [facility] OR;  Service: Orthopedics;  Laterality: Right;   Family History History reviewed. No pertinent family history.  Social History Social History   Tobacco Use   Smoking status: Former    Current packs/day: 0.00    Types: Cigarettes    Quit date: 03/16/1983    Years since quitting: 40.3   Smokeless tobacco: Never  Vaping Use   Vaping status: Never Used  Substance Use Topics   Alcohol use: Not Currently    Comment: rarely   Drug use: Never   Allergies Other and Aspirin  Review of Systems Review of Systems  All other systems reviewed and are negative.   Physical Exam Vital Signs  I have  reviewed the triage vital signs BP (!) 140/70 (BP Location: Right Arm)   Pulse (!) 42   Temp 97.8 F (36.6 C)   Resp 11   Ht 6' (1.829 m)   Wt 93 kg   SpO2 100%   BMI 27.80 kg/m  Physical Exam Vitals and nursing note reviewed.  Constitutional:      General: He is not in acute distress.    Appearance: Normal appearance.  HENT:     Mouth/Throat:     Mouth: Mucous membranes are moist.  Eyes:     Conjunctiva/sclera: Conjunctivae normal.  Cardiovascular:     Rate and Rhythm: Normal rate and regular rhythm.  Pulmonary:     Effort: Pulmonary effort is normal. No respiratory distress.     Breath sounds: Normal breath sounds.  Abdominal:     General: Abdomen is flat.     Palpations: Abdomen is soft.     Tenderness: There is no abdominal tenderness.  Musculoskeletal:     Right lower leg: No edema.     Left lower leg: No edema.  Skin:    General: Skin is warm and dry.     Capillary Refill: Capillary refill takes less than 2 seconds.  Neurological:     Mental Status: He is alert and oriented to person, place, and time. Mental status is at baseline.     Comments: Cranial nerves II through XII intact, strength 5 out of 5 in the bilateral upper and lower extremities, no sensory deficit to light touch, no dysmetria on finger-nose-finger testing, ambulatory with steady gait.  No dysdiadochokinesia or abnormal heel-to-shin testing.  Psychiatric:        Mood and Affect: Mood normal.        Behavior: Behavior normal.     ED Results and Treatments Labs (all labs ordered are listed, but only abnormal results are displayed) Labs Reviewed  BASIC METABOLIC PANEL - Abnormal; Notable for the following components:      Result Value   Glucose, Bld 100 (*)    All other components within normal limits  CBC - Abnormal; Notable for the following components:   WBC 3.7 (*)    Platelets 131 (*)    All other components within normal limits  URINALYSIS, ROUTINE W REFLEX MICROSCOPIC  LIPID PANEL   CBC  BASIC METABOLIC PANEL  MAGNESIUM  PHOSPHORUS  CBG MONITORING, ED  Radiology MR Brain Wo Contrast (neuro protocol)  Result Date: 06/29/2023 CLINICAL DATA:  Neuro deficit, acute, stroke suspected EXAM: MRI HEAD WITHOUT CONTRAST TECHNIQUE: Multiplanar, multiecho pulse sequences of the brain and surrounding structures were obtained without intravenous contrast. COMPARISON:  None Available. FINDINGS: Brain: Acute infarct in the left hemi pons. No hemorrhage. No hydrocephalus. No extra-axial fluid collection. No mass effect. No mass lesion. Chronic infarcts in the left parietal lobe. Vascular: Normal flow voids. Skull and upper cervical spine: Normal marrow signal. Sinuses/Orbits: No middle ear or mastoid effusion. Paranasal sinuses are clear. Orbits are unremarkable. Other: None. IMPRESSION: Acute infarct in the left hemi pons. No hemorrhage. Electronically Signed   By: Lorenza Cambridge M.D.   On: 06/29/2023 20:21    Pertinent labs & imaging results that were available during my care of the patient were reviewed by me and considered in my medical decision making (see MDM for details).  Medications Ordered in ED Medications  clopidogrel (PLAVIX) tablet 75 mg (has no administration in time range)  labetalol (NORMODYNE) injection 5 mg (has no administration in time range)  acetaminophen (TYLENOL) tablet 650 mg (has no administration in time range)  prochlorperazine (COMPAZINE) injection 5 mg (has no administration in time range)  melatonin tablet 5 mg (has no administration in time range)  polyethylene glycol (MIRALAX / GLYCOLAX) packet 17 g (has no administration in time range)  aspirin EC tablet 81 mg (has no administration in time range)  atorvastatin (LIPITOR) tablet 80 mg (has no administration in time range)                                                                                                                                      Procedures .Critical Care  Performed by: Lonell Grandchild, MD Authorized by: Lonell Grandchild, MD   Critical care provider statement:    Critical care time (minutes):  30   Critical care was time spent personally by me on the following activities:  Development of treatment plan with patient or surrogate, discussions with consultants, evaluation of patient's response to treatment, examination of patient, ordering and review of laboratory studies, ordering and review of radiographic studies, ordering and performing treatments and interventions, pulse oximetry, re-evaluation of patient's condition and review of old charts   (including critical care time)  Medical Decision Making / ED Course   MDM:  77 year old male presenting to the emergency department with balance issues.  Patient overall very well-appearing, currently has normal neurologic exam.  Does report period of prolonged abnormal balance and falling over to 1 side.  History is somewhat concerning for possible stroke or TIA.  Will obtain MRI brain to further evaluate.  Differential also includes peripheral vertigo.  He reports feeling weakness in both legs however his leg strength is normal bilaterally he has a normal gait, no back pain, doubt primary spinal or cauda equina process such as infection or compression.  Will reassess.  If MRI is negative anticipate [facility] to baseline.  Clinical Course as of 06/29/23 2106  Thu Jun 29, 2023  2059 MRI brain shows pontine stroke. Patient continues to have no symptoms at this time. Discussed with neurologist Dr. Derry Lory. He placed order for plavix and he will come see the patient. Discussed with Dr. Margo Aye with hospitalist and they will come and see the patient and admit him. TNK was considered however patient out of window as LKN time was > 24 hrs. No  signs of LVO. CTA ordered for further workup of etiology [WS]    Clinical Course User Index [WS] Lonell Grandchild, MD     Additional history obtained: -Additional history obtained from family -External records from outside source obtained and reviewed including: Chart review including previous notes, labs, imaging, consultation notes including prior notes    Lab Tests: -I ordered, reviewed, and interpreted labs.   The pertinent results include:   Labs Reviewed  BASIC METABOLIC PANEL - Abnormal; Notable for the following components:      Result Value   Glucose, Bld 100 (*)    All other components within normal limits  CBC - Abnormal; Notable for the following components:   WBC 3.7 (*)    Platelets 131 (*)    All other components within normal limits  URINALYSIS, ROUTINE W REFLEX MICROSCOPIC  LIPID PANEL  CBC  BASIC METABOLIC PANEL  MAGNESIUM  PHOSPHORUS  CBG MONITORING, ED    Notable for nonspecific leukopenia   EKG   EKG Interpretation Date/Time:  Thursday June 29 2023 13:40:38 EDT Ventricular Rate:  49 PR Interval:  164 QRS Duration:  74 QT Interval:  430 QTC Calculation: 388 R Axis:   60  Text Interpretation: Sinus bradycardia Nonspecific T wave abnormality Abnormal ECG When compared with ECG of 28-Nov-2019 11:30, No significant change since last tracing Confirmed by Alvino Blood ([id number]) on 06/29/2023 6:10:28 PM         Imaging Studies ordered: I ordered imaging studies including MRI brain On my interpretation imaging demonstrates acute stroke  I independently visualized and interpreted imaging. I agree with the radiologist interpretation   Medicines ordered and prescription drug management: Meds ordered this encounter  Medications   clopidogrel (PLAVIX) tablet 75 mg   labetalol (NORMODYNE) injection 5 mg   acetaminophen (TYLENOL) tablet 650 mg   prochlorperazine (COMPAZINE) injection 5 mg   melatonin tablet 5 mg   polyethylene glycol  (MIRALAX / GLYCOLAX) packet 17 g   aspirin EC tablet 81 mg   atorvastatin (LIPITOR) tablet 80 mg    -I have reviewed the patients home medicines and have made adjustments as needed   Consultations Obtained: I requested consultation with the neurologist,  and discussed lab and imaging findings as well as pertinent plan - they recommend: plavix, they will consult    Cardiac Monitoring: The patient was maintained on a cardiac monitor.  I personally viewed and interpreted the cardiac monitored which showed an underlying rhythm of: sinus bradycardia   Social Determinants of Health:  Diagnosis or treatment significantly limited by social determinants of health: former smoker   Reevaluation: After the interventions noted above, I reevaluated the patient and found that their symptoms have improved  Co morbidities that complicate the patient evaluation  Past Medical History:  Diagnosis Date   Arthritis       Dispostion: Disposition decision including need for hospitalization was considered, and patient  admitted to the hospital.    Final Clinical Impression(s) / ED Diagnoses Final diagnoses:  Left pontine stroke [facility])     This chart was dictated using voice recognition software.  Despite best efforts to proofread,  errors can occur which can change the documentation meaning.    Lonell Grandchild, MD 06/29/23 2106

## 2023-06-30 ENCOUNTER — Observation Stay (HOSPITAL_COMMUNITY): Payer: No Typology Code available for payment source

## 2023-06-30 DIAGNOSIS — I6389 Other cerebral infarction: Secondary | ICD-10-CM | POA: Diagnosis not present

## 2023-06-30 DIAGNOSIS — I639 Cerebral infarction, unspecified: Secondary | ICD-10-CM | POA: Diagnosis not present

## 2023-06-30 LAB — ECHOCARDIOGRAM COMPLETE
AR max vel: 3.26 cm2
AV Area VTI: 2.9 cm2
AV Area mean vel: 2.94 cm2
AV Mean grad: 2 mm[Hg]
AV Peak grad: 2.3 mm[Hg]
Ao pk vel: 0.76 m/s
Area-P 1/2: 2.65 cm2
Height: 72 in
S' Lateral: 2.8 cm
Weight: 3241.64 [oz_av]

## 2023-06-30 LAB — HEPATIC FUNCTION PANEL
ALT: 18 U/L (ref 0–44)
AST: 24 U/L (ref 15–41)
Albumin: 3.3 g/dL — ABNORMAL LOW (ref 3.5–5.0)
Alkaline Phosphatase: 62 U/L (ref 38–126)
Bilirubin, Direct: 0.2 mg/dL (ref 0.0–0.2)
Indirect Bilirubin: 0.8 mg/dL (ref 0.3–0.9)
Total Bilirubin: 1 mg/dL (ref 0.3–1.2)
Total Protein: 6.3 g/dL — ABNORMAL LOW (ref 6.5–8.1)

## 2023-06-30 LAB — RAPID URINE DRUG SCREEN, HOSP PERFORMED
Amphetamines: NOT DETECTED
Barbiturates: NOT DETECTED
Benzodiazepines: NOT DETECTED
Cocaine: NOT DETECTED
Opiates: NOT DETECTED
Tetrahydrocannabinol: NOT DETECTED

## 2023-06-30 LAB — HEMOGLOBIN A1C
Hgb A1c MFr Bld: 5.3 % (ref 4.8–5.6)
Mean Plasma Glucose: 105.41 mg/dL

## 2023-06-30 LAB — LIPID PANEL
Cholesterol: 158 mg/dL (ref 0–200)
HDL: 45 mg/dL (ref >40)
LDL Cholesterol: 96 mg/dL (ref 0–99)
Total CHOL/HDL Ratio: 3.5 {ratio}
Triglycerides: 84 mg/dL (ref <150)
VLDL: 17 mg/dL (ref 0–40)

## 2023-06-30 LAB — CBC
HCT: 40.9 % (ref 39.0–52.0)
Hemoglobin: 13.9 g/dL (ref 13.0–17.0)
MCH: 30 pg (ref 26.0–34.0)
MCHC: 34 g/dL (ref 30.0–36.0)
MCV: 88.3 fL (ref 80.0–100.0)
Platelets: 120 10*3/uL — ABNORMAL LOW (ref 150–400)
RBC: 4.63 MIL/uL (ref 4.22–5.81)
RDW: 13.8 % (ref 11.5–15.5)
WBC: 3.6 10*3/uL — ABNORMAL LOW (ref 4.0–10.5)
nRBC: 0 % (ref 0.0–0.2)

## 2023-06-30 LAB — TSH: TSH: 1.565 u[IU]/mL (ref 0.350–4.500)

## 2023-06-30 LAB — BASIC METABOLIC PANEL
Anion gap: 6 (ref 5–15)
BUN: 11 mg/dL (ref 8–23)
CO2: 28 mmol/L (ref 22–32)
Calcium: 9.5 mg/dL (ref 8.9–10.3)
Chloride: 104 mmol/L (ref 98–111)
Creatinine, Ser: 1.13 mg/dL (ref 0.61–1.24)
GFR, Estimated: >60 mL/min (ref >60)
Glucose, Bld: 93 mg/dL (ref 70–99)
Potassium: 3.8 mmol/L (ref 3.5–5.1)
Sodium: 138 mmol/L (ref 135–145)

## 2023-06-30 LAB — PHOSPHORUS: Phosphorus: 2.8 mg/dL (ref 2.5–4.6)

## 2023-06-30 LAB — MAGNESIUM: Magnesium: 2 mg/dL (ref 1.7–2.4)

## 2023-06-30 MED ORDER — ATORVASTATIN CALCIUM 80 MG PO TABS: 80.0000 mg | ORAL_TABLET | Freq: Every day | ORAL | 1 refills | Status: DC

## 2023-06-30 MED ORDER — SODIUM CHLORIDE 0.9 % IV SOLN: INTRAVENOUS | Status: DC

## 2023-06-30 MED ORDER — CLOPIDOGREL BISULFATE 75 MG PO TABS: 75.0000 mg | ORAL_TABLET | Freq: Every day | ORAL | 0 refills | Status: DC

## 2023-06-30 MED ORDER — ENOXAPARIN SODIUM 40 MG/0.4ML IJ SOSY
40.0000 mg | PREFILLED_SYRINGE | INTRAMUSCULAR | Status: DC
  Administered 2023-06-30: 40 mg via SUBCUTANEOUS
  Filled 2023-06-30: qty 0.4

## 2023-06-30 MED ORDER — ASPIRIN 81 MG PO TBEC: 81.0000 mg | DELAYED_RELEASE_TABLET | Freq: Every day | ORAL | 0 refills | Status: DC

## 2023-06-30 NOTE — Progress Notes (Signed)
OT Cancellation Note  Patient Details Name: MONU BRZOSKA MRN: [id number] DOB: 12-03-1945   Cancelled Treatment:    Reason Eval/Treat Not Completed: Other (comment) (Pt eating breakfast adn requesting OT return at a later time.)  [facility] 06/30/2023, 9:25 AM Luisa Dago, OT/L   Acute OT Clinical Specialist Acute Rehabilitation Services Pager [phone] Office [phone]

## 2023-06-30 NOTE — Progress Notes (Signed)
Reviewed Discharge paperwork with the patient and his wife. Both voiced complete understanding. Walker provided and NT wheeled pt off unit.

## 2023-06-30 NOTE — Progress Notes (Addendum)
STROKE TEAM PROGRESS NOTE   SUBJECTIVE (INTERVAL HISTORY) No family is at the bedside.  Overall his condition is rapidly improving.  He is lying in bed, stated that he feels good, he worked with PT OT and doing well, recommend outpatient.   OBJECTIVE Temp:  [97.5 F (36.4 C)-98.5 F (36.9 C)] 98.5 F (36.9 C) (10/18 1527) Pulse Rate:  [42-60] 53 (10/18 1527) Cardiac Rhythm: Sinus bradycardia (10/18 0912) Resp:  [11-16] 16 (10/18 1527) BP: (124-158)/(70-90) 124/77 (10/18 1527) SpO2:  [96 %-100 %] 100 % (10/18 1527) Weight:  [91.9 kg] 91.9 kg (10/17 2201)  No results for input(s): "GLUCAP" in the last 168 hours. Recent Labs  Lab 06/29/23 1422 06/30/23 0843  NA 138 138  K 4.3 3.8  CL 107 104  CO2 25 28  GLUCOSE 100* 93  BUN 11 11  CREATININE 1.15 1.13  CALCIUM 9.5 9.5  MG  --  2.0  PHOS  --  2.8   No results for input(s): "AST", "ALT", "ALKPHOS", "BILITOT", "PROT", "ALBUMIN" in the last 168 hours. Recent Labs  Lab 06/29/23 1422 06/30/23 0843  WBC 3.7* 3.6*  HGB 13.5 13.9  HCT 41.2 40.9  MCV 92.2 88.3  PLT 131* 120*   No results for input(s): "CKTOTAL", "CKMB", "CKMBINDEX", "TROPONINI" in the last 168 hours. No results for input(s): "LABPROT", "INR" in the last 72 hours. Recent Labs    06/29/23 1402  COLORURINE YELLOW  LABSPEC 1.005  PHURINE 7.0  GLUCOSEU NEGATIVE  HGBUR NEGATIVE  BILIRUBINUR NEGATIVE  KETONESUR NEGATIVE  PROTEINUR NEGATIVE  NITRITE NEGATIVE  LEUKOCYTESUR NEGATIVE       Component Value Date/Time   CHOL 158 06/30/2023 0843   TRIG 84 06/30/2023 0843   HDL 45 06/30/2023 0843   CHOLHDL 3.5 06/30/2023 0843   VLDL 17 06/30/2023 0843   LDLCALC 96 06/30/2023 0843   Lab Results  Component Value Date   HGBA1C 5.3 06/30/2023      Component Value Date/Time   LABOPIA NONE DETECTED 06/30/2023 0812   COCAINSCRNUR NONE DETECTED 06/30/2023 0812   LABBENZ NONE DETECTED 06/30/2023 0812   AMPHETMU NONE DETECTED 06/30/2023 0812   THCU NONE  DETECTED 06/30/2023 0812   LABBARB NONE DETECTED 06/30/2023 0812    No results for input(s): "ETH" in the last 168 hours.  I have personally reviewed the radiological images below and agree with the radiology interpretations.  ECHOCARDIOGRAM COMPLETE  Result Date: 06/30/2023    ECHOCARDIOGRAM REPORT   Patient Name:   Gerald Whitaker Date of Exam: 06/30/2023 Medical Rec #:  [id number]      Height:       72.0 in Accession #:    [id number]     Weight:       202.6 lb Date of Birth:  1946/07/18       BSA:          2.142 m Patient Age:    77 years       BP:           140/72 mmHg Patient Gender: M              HR:           65 bpm. Exam Location:  Inpatient Procedure: 2D Echo, Cardiac Doppler and Color Doppler Indications:   Stroke I63.9  History:       Patient has no prior history of Echocardiogram examinations.                Stroke.  Sonographer:   Darlys Gales Referring      [phone] ANAND D HONGALGI Phys: IMPRESSIONS  1. Left ventricular ejection fraction, by estimation, is 60 to 65%. The left ventricle has normal function. The left ventricle has no regional wall motion abnormalities. There is mild left ventricular hypertrophy. Left ventricular diastolic parameters are consistent with Grade II diastolic dysfunction (pseudonormalization). Elevated left atrial pressure.  2. Right ventricular systolic function is normal. The right ventricular size is normal. Tricuspid regurgitation signal is inadequate for assessing PA pressure.  3. The mitral valve is normal in structure. No evidence of mitral valve regurgitation.  4. The aortic valve is tricuspid. Aortic valve regurgitation is not visualized. No aortic stenosis is present.  5. The inferior vena cava is normal in size with greater than 50% respiratory variability, suggesting right atrial pressure of 3 mmHg. FINDINGS  Left Ventricle: Left ventricular ejection fraction, by estimation, is 60 to 65%. The left ventricle has normal function. The left ventricle has no  regional wall motion abnormalities. The left ventricular internal cavity size was normal in size. There is  mild left ventricular hypertrophy. Left ventricular diastolic parameters are consistent with Grade II diastolic dysfunction (pseudonormalization). Elevated left atrial pressure. Right Ventricle: The right ventricular size is normal. No increase in right ventricular wall thickness. Right ventricular systolic function is normal. Tricuspid regurgitation signal is inadequate for assessing PA pressure. Left Atrium: Left atrial size was normal in size. Right Atrium: Right atrial size was normal in size. Pericardium: There is no evidence of pericardial effusion. Mitral Valve: The mitral valve is normal in structure. No evidence of mitral valve regurgitation. Tricuspid Valve: The tricuspid valve is normal in structure. Tricuspid valve regurgitation is trivial. Aortic Valve: The aortic valve is tricuspid. Aortic valve regurgitation is not visualized. No aortic stenosis is present. Aortic valve mean gradient measures 2.0 mmHg. Aortic valve peak gradient measures 2.3 mmHg. Aortic valve area, by VTI measures 2.90 cm. Pulmonic Valve: The pulmonic valve was not well visualized. Pulmonic valve regurgitation is trivial. Aorta: The aortic root is normal in size and structure. Venous: The inferior vena cava is normal in size with greater than 50% respiratory variability, suggesting right atrial pressure of 3 mmHg. IAS/Shunts: The interatrial septum was not well visualized.  LEFT VENTRICLE PLAX 2D LVIDd:         5.00 cm   Diastology LVIDs:         2.80 cm   LV e' medial:    4.46 cm/s LV PW:         1.20 cm   LV E/e' medial:  16.6 LV IVS:        1.00 cm   LV e' lateral:   7.72 cm/s LVOT diam:     2.00 cm   LV E/e' lateral: 9.6 LV SV:         58 LV SV Index:   27 LVOT Area:     3.14 cm  RIGHT VENTRICLE RV S prime:     12.50 cm/s TAPSE (M-mode): 2.1 cm LEFT ATRIUM             Index        RIGHT ATRIUM           Index LA Vol  (A2C):   46.6 ml 21.75 ml/m  RA Area:     14.20 cm LA Vol (A4C):   47.4 ml 22.13 ml/m  RA Volume:   31.60 ml  14.75 ml/m LA Biplane Vol: 46.9 ml 21.89  ml/m  AORTIC VALVE AV Area (Vmax):    3.26 cm AV Area (Vmean):   2.94 cm AV Area (VTI):     2.90 cm AV Vmax:           76.40 cm/s AV Vmean:          61.200 cm/s AV VTI:            0.199 m AV Peak Grad:      2.3 mmHg AV Mean Grad:      2.0 mmHg LVOT Vmax:         79.20 cm/s LVOT Vmean:        57.300 cm/s LVOT VTI:          0.184 m LVOT/AV VTI ratio: 0.92  AORTA Ao Root diam: 3.50 cm MITRAL VALVE MV Area (PHT): 2.65 cm    SHUNTS MV Decel Time: 286 msec    Systemic VTI:  0.18 m MV E velocity: 73.90 cm/s  Systemic Diam: 2.00 cm MV A velocity: 58.70 cm/s MV E/A ratio:  1.26 Epifanio Lesches MD Electronically signed by Epifanio Lesches MD Signature Date/Time: 06/30/2023/2:58:30 PM    Final    CT ANGIO HEAD NECK W WO CM  Result Date: 06/29/2023 CLINICAL DATA:  Balance issues, falling EXAM: CT ANGIOGRAPHY HEAD AND NECK WITH AND WITHOUT CONTRAST TECHNIQUE: Multidetector CT imaging of the head and neck was performed using the standard protocol during bolus administration of intravenous contrast. Multiplanar CT image reconstructions and MIPs were obtained to evaluate the vascular anatomy. Carotid stenosis measurements (when applicable) are obtained utilizing NASCET criteria, using the distal internal carotid diameter as the denominator. RADIATION DOSE REDUCTION: This exam was performed according to the departmental dose-optimization program which includes automated exposure control, adjustment of the mA and/or kV according to patient size and/or use of iterative reconstruction technique. CONTRAST:  75mL OMNIPAQUE IOHEXOL 350 MG/ML SOLN COMPARISON:  06/29/2023 MRI head, no prior CTA head and neck FINDINGS: CT HEAD FINDINGS Brain: The acute infarct in the left hemi pons noted on the same-day MRI is not particularly distinct on CT. No evidence of additional  acute infarct, hemorrhage, mass, mass effect, or midline shift. No hydrocephalus or extra-axial fluid collection. Mildly prominent extra-axial CSF spaces. Vascular: No hyperdense vessel. Skull: Negative for fracture or focal lesion. Sinuses/Orbits: No acute finding. Other: The mastoid air cells are well aerated. CTA NECK FINDINGS Aortic arch: Standard branching. Imaged portion shows no evidence of aneurysm or dissection. No significant stenosis of the major arch vessel origins. Right carotid system: No evidence of dissection, occlusion, or hemodynamically significant stenosis (greater than 50%). Left carotid system: No evidence of dissection, occlusion, or hemodynamically significant stenosis (greater than 50%). Vertebral arteries: No evidence of dissection, occlusion, or hemodynamically significant stenosis (greater than 50%). Skeleton: No acute osseous abnormality. Degenerative changes in the cervical spine. Other neck: Multiple nodules in the bilateral thyroid lobes, the largest of which measures up to 1.9 cm. Upper chest: No focal pulmonary opacity or pleural effusion. Emphysema. Review of the MIP images confirms the above findings CTA HEAD FINDINGS Anterior circulation: Both internal carotid arteries are patent to the termini, without significant stenosis. A1 segments patent. Normal anterior communicating artery. Anterior cerebral arteries are patent to their distal aspects without significant stenosis. No M1 stenosis or occlusion. MCA branches perfused to their distal aspects without significant stenosis. Posterior circulation: Vertebral arteries patent to the vertebrobasilar junction without significant stenosis. Posterior inferior cerebellar arteries patent proximally. Basilar patent to its distal aspect without significant  stenosis. Superior cerebellar arteries patent proximally. Patent P1 segments. Moderate to severe stenosis in the proximal and distal right P2 (series 9, images 251 and 258). PCAs  otherwise perfused to their distal aspects without significant stenosis. The bilateral posterior communicating arteries are diminutive but patent. Venous sinuses: As permitted by contrast timing, patent. Anatomic variants: None significant. No evidence of aneurysm or vascular malformation. Review of the MIP images confirms the above findings IMPRESSION: 1. Moderate to severe stenosis in the proximal and distal right P2. No intracranial large vessel occlusion. 2. No hemodynamically significant stenosis in the neck. 3. Multiple nodules in the bilateral thyroid lobes, the largest of which measures up to 1.9 cm. If this has not previously been evaluated, a non-emergent ultrasound of the thyroid is recommended. (Reference: J Am Coll Radiol. 2015 Feb;12(2): 143-50) Electronically Signed   By: Wiliam Ke M.D.   On: 06/29/2023 23:54   MR Brain Wo Contrast (neuro protocol)  Result Date: 06/29/2023 CLINICAL DATA:  Neuro deficit, acute, stroke suspected EXAM: MRI HEAD WITHOUT CONTRAST TECHNIQUE: Multiplanar, multiecho pulse sequences of the brain and surrounding structures were obtained without intravenous contrast. COMPARISON:  None Available. FINDINGS: Brain: Acute infarct in the left hemi pons. No hemorrhage. No hydrocephalus. No extra-axial fluid collection. No mass effect. No mass lesion. Chronic infarcts in the left parietal lobe. Vascular: Normal flow voids. Skull and upper cervical spine: Normal marrow signal. Sinuses/Orbits: No middle ear or mastoid effusion. Paranasal sinuses are clear. Orbits are unremarkable. Other: None. IMPRESSION: Acute infarct in the left hemi pons. No hemorrhage. Electronically Signed   By: Lorenza Cambridge M.D.   On: 06/29/2023 20:21     PHYSICAL EXAM  Temp:  [97.5 F (36.4 C)-98.5 F (36.9 C)] 98.5 F (36.9 C) (10/18 1527) Pulse Rate:  [42-60] 53 (10/18 1527) Resp:  [11-16] 16 (10/18 1527) BP: (124-158)/(70-90) 124/77 (10/18 1527) SpO2:  [96 %-100 %] 100 % (10/18  1527) Weight:  [91.9 kg] 91.9 kg (10/17 2201)  General - Well nourished, well developed, in no apparent distress.  Ophthalmologic - fundi not visualized due to noncooperation.  Cardiovascular - Regular rhythm and rate.  Mental Status -  Level of arousal and orientation to time, place, and person were intact. Language including expression, naming, repetition, comprehension was assessed and found intact. Fund of Knowledge was assessed and was intact.  Cranial Nerves II - XII - II - Visual field intact OU. III, IV, VI - Extraocular movements intact. V - Facial sensation intact bilaterally. VII - Facial movement intact bilaterally. VIII - Hearing & vestibular intact bilaterally. X - Palate elevates symmetrically. XI - Chin turning & shoulder shrug intact bilaterally. XII - Tongue protrusion intact.  Motor Strength - The patient's strength was normal in all extremities and pronator drift was absent.  Bulk was normal and fasciculations were absent   Motor Tone - Muscle tone was assessed at the neck and appendages and was normal.  Reflexes - The patient's reflexes were symmetrical in all extremities and he had no pathological reflexes.  Sensory - Light touch, temperature/pinprick were assessed and were symmetrical.    Coordination - The patient had normal movements in the hands and feet with no ataxia or dysmetria.  Tremor was absent.  Gait and Station - deferred.   ASSESSMENT/PLAN Gerald Whitaker is a 77 y.o. male with history of arthritis admitted for falls, imbalance, dizziness and right side leaning. No tPA given due to outside window.    Stroke:  left pontine infarct  likely secondary to small vessel disease source MRI left pontine infarct CT head and neck moderate to severe right P2 proximal and distal stenosis. 2D Echo EF 60 to 65% LDL 96 HgbA1c 5.3 UDS negative Lovenox for VTE prophylaxis No antithrombotic prior to admission, now on aspirin 81 mg daily and  clopidogrel 75 mg daily DAPT for 3 weeks and then aspirin alone. Patient counseled to be compliant with his antithrombotic medications Ongoing aggressive stroke risk factor management Therapy recommendations: Outpatient PT and OT Disposition: Home today  BP management Stable Long term BP goal normotensive  Hyperlipidemia Home meds: None LDL 96, goal < 70 Now on Lipitor 80 Continue statin at discharge  Other Stroke Risk Factors Advanced age  Other Active Problems Mild leukopenia WBC 3.7  Hospital day # 0  Neurology will sign off. Please call with questions. Pt will follow up with stroke clinic NP at [facility] in about 4 weeks. Thanks for the consult.   Marvel Plan, MD PhD Stroke Neurology 06/30/2023 4:38 PM  I discussed with Dr. Waymon Amato. I spent additional 30 inpatient minute face-to-face time with the patient, more than 50% of which was spent in counseling and coordination of care, reviewing test results, images and medication, and discussing the diagnosis, treatment plan and potential prognosis. This patient's care requiresreview of multiple databases, neurological assessment, discussion with family, other specialists and medical decision making of high complexity.      To contact Stroke Continuity provider, please refer to [email or web address]. After hours, contact General Neurology

## 2023-06-30 NOTE — Discharge Instructions (Signed)

## 2023-06-30 NOTE — Evaluation (Signed)
Clinical/Bedside Swallow Evaluation Patient Details  Name: Gerald Whitaker MRN: [id number] Date of Birth: [birth year]  Today's Date: 06/30/2023 Time: SLP Start Time (ACUTE ONLY): 1054 SLP Stop Time (ACUTE ONLY): 1104 SLP Time Calculation (min) (ACUTE ONLY): 10 min  Past Medical History:  Past Medical History:  Diagnosis Date   Arthritis    Past Surgical History:  Past Surgical History:  Procedure Laterality Date   TOTAL HIP ARTHROPLASTY Left 09/30/2019   Procedure: LEFT TOTAL HIP ARTHROPLASTY ANTERIOR APPROACH;  Surgeon: Tarry Kos, MD;  Location: [facility] OR;  Service: Orthopedics;  Laterality: Left;   TOTAL HIP ARTHROPLASTY Right 12/02/2019   Procedure: RIGHT TOTAL HIP ARTHROPLASTY ANTERIOR APPROACH;  Surgeon: Tarry Kos, MD;  Location: [facility] OR;  Service: Orthopedics;  Laterality: Right;   HPI:  Gerald Whitaker is a 77 y.o. male with no significant past medical history who presents to the ED from home due to bilateral lower extremity weakness and sensation of falling over onto the right side.  Associated with mild dizziness.  Symptoms started yesterday.  The patient denies any falls.  While in the ED his symptoms initially persisted then went away.  Noncontrast brain MRI revealed acute infarct in the left hemipons with no hemorrhage.    Assessment / Plan / Recommendation  Clinical Impression  Pt presents with a grossly functional oropharyngeal swallow per clinical swallow assessment completed today. Pt reported occasional instances of "choking on my saliva" earlier this admission with onset of CVA symptoms; however, he reported any previous, transient dysphagia symptoms have resolved. He denied hx of dysphagia or current concerns with swallowing liquids, solids, or medications.   SLP observed pt consume thin liquids by straw, applesauce, and cracker + peanut butter. Oral prep and transit prompt with complete oral clearance. Pharyngeal swallow initiation appeared timely to palpation with  laryngeal elevation noted. No overt or subtle s/s of aspiration observed across trials.       Speech screen: Conversational speech 100% fluent and intelligible with no dysarthria or anomia noted. Pt denied acute changes to speech or language since CVA. A formal speech/language assessment is not indicated at this time.       Recommend continue current diet and PO meds as tolerated. No acute SLP needs identified at this time. SLP will sign off. SLP Visit Diagnosis:  (r/o dysphagia)    Aspiration Risk  No limitations    Diet Recommendation Regular;Thin liquid    Liquid Administration via: Cup;Straw Medication Administration: Whole meds with liquid Supervision: Patient able to self feed Postural Changes: Seated upright at 90 degrees    Other  Recommendations Oral Care Recommendations: Oral care BID    Recommendations for follow up therapy are one component of a multi-disciplinary discharge planning process, led by the attending physician.  Recommendations may be updated based on patient status, additional functional criteria and insurance authorization.  Follow up Recommendations No SLP follow up       Swallow Study   General Date of Onset: 06/29/23 HPI: Gerald Whitaker is a 77 y.o. male with no significant past medical history who presents to the ED from home due to bilateral lower extremity weakness and sensation of falling over onto the right side.  Associated with mild dizziness.  Symptoms started yesterday.  The patient denies any falls.  While in the ED his symptoms initially persisted then went away.  Noncontrast brain MRI revealed acute infarct in the left hemipons with no hemorrhage. Type of Study: Bedside Swallow Evaluation Previous Swallow  Assessment: none per chart; pt denied hx of dysphagia Diet Prior to this Study: Regular;Thin liquids (Level 0) Temperature Spikes Noted: No Respiratory Status: Room air History of Recent Intubation: No Behavior/Cognition:  Alert;Cooperative;Pleasant mood Oral Cavity Assessment: Within Functional Limits Oral Care Completed by SLP: Other (Comment) (recent completion by patient with staff supervision) Oral Cavity - Dentition: Missing dentition;Adequate natural dentition Vision: Functional for self-feeding Self-Feeding Abilities: Able to feed self Patient Positioning: Upright in bed Baseline Vocal Quality: Normal Volitional Swallow: Able to elicit    Oral/Motor/Sensory Function Overall Oral Motor/Sensory Function: Within functional limits   Ice Chips Ice chips: Not tested   Thin Liquid Thin Liquid: Within functional limits Presentation: Self Fed;Straw    Nectar Thick Nectar Thick Liquid: Not tested   Honey Thick Honey Thick Liquid: Not tested   Puree Puree: Within functional limits Presentation: Self Fed;Spoon   Solid     Solid: Within functional limits Presentation: Self Fed      [name] 06/30/2023,11:19 AM

## 2023-06-30 NOTE — Evaluation (Signed)
Physical Therapy Evaluation  Patient Details Name: Gerald Whitaker MRN: [id number] DOB: [birth year] Today's Date: 06/30/2023  History of Present Illness  77 y.o. male who presents with legs giving out and falling over to the right. MRI Brain demonstrated a small L pontine stroke. PMH: arthritis, B THA.   Clinical Impression  Pt admitted with above diagnosis. Pt currently with functional limitations due to the deficits listed below (see PT Problem List). At the time of PT eval pt was able to perform transfers and ambulation with gross supervision for safety and RW for support. Pt is near baseline of function however with continued mild balance deficits and R bias with OOB functional tasks. Recommend neuro outpatient PT follow up at d/c. Acutely, pt will benefit from acute skilled PT to increase their independence and safety with mobility to allow discharge.           If plan is discharge home, recommend the following: A little help with walking and/or transfers;A little help with bathing/dressing/bathroom;Assistance with cooking/housework;Help with stairs or ramp for entrance;Assist for transportation   Can travel by private vehicle        Equipment Recommendations Rolling walker (2 wheels)  Recommendations for Other Services       Functional Status Assessment Patient has had a recent decline in their functional status and demonstrates the ability to make significant improvements in function in a reasonable and predictable amount of time.     Precautions / Restrictions Precautions Precautions: Fall Restrictions Weight Bearing Restrictions: No      Mobility  Bed Mobility Overal bed mobility: Independent                  Transfers Overall transfer level: Needs assistance Equipment used: Rolling walker (2 wheels) Transfers: Sit to/from Stand Sit to Stand: Supervision           General transfer comment: Light supervision provided for safety as pt powered up to full  stand. Pt able to manage positioning himself within the walker well and did not need immediate UE support.    Ambulation/Gait Ambulation/Gait assistance: Supervision Gait Distance (Feet): 300 Feet Assistive device: Rolling walker (2 wheels) Gait Pattern/deviations: Step-through pattern, Decreased stride length, Trunk flexed Gait velocity: Decreased Gait velocity interpretation: <1.31 ft/sec, indicative of household ambulator   General Gait Details: VC's for improved posture, closer walker proximity and forward gaze. Appears steady with the RW for support. No overt LOB noted with extended ambulation distance.  Stairs Stairs: Yes Stairs assistance: Supervision Stair Management: Two rails, Forwards, Alternating pattern Number of Stairs: 5 General stair comments: Practice stairs in rehab gym. Pt was able to negotiate well without any unsteadiness noted.  Wheelchair Mobility     Tilt Bed    Modified Rankin (Stroke Patients Only)       Balance Overall balance assessment: Needs assistance   Sitting balance-Leahy Scale: Good       Standing balance-Leahy Scale: Fair                               Pertinent Vitals/Pain Pain Assessment Pain Assessment: No/denies pain    Home Living Family/patient expects to be discharged to:: Private residence Living Arrangements: Spouse/significant other;Children Available Help at Discharge: Family;Available 24 hours/day Type of Home: House Home Access: Stairs to enter Entrance Stairs-Rails: Right;Left;Can reach both Entrance Stairs-Number of Steps: 4 Alternate Level Stairs-Number of Steps: flight Home Layout: Two level;Able to live on main level with  bedroom/bathroom;Laundry or work area in [employer] Equipment: Agricultural consultant (2 wheels);Cane - single point      Prior Function Prior Level of Function : Independent/Modified Independent;Other (comment) (stays active; enjoys yardwork)             Mobility Comments:  has been staggering and "almost falling for a couple of weeks, but got really bad this past Tuesday       Extremity/Trunk Assessment   Upper Extremity Assessment Upper Extremity Assessment: Defer to OT evaluation    Lower Extremity Assessment Lower Extremity Assessment: RLE deficits/detail;LLE deficits/detail RLE Sensation: history of peripheral neuropathy LLE Sensation: history of peripheral neuropathy    Cervical / Trunk Assessment Cervical / Trunk Assessment: Other exceptions Cervical / Trunk Exceptions: R bias  Communication   Communication Communication: Hearing impairment  Cognition Arousal: Alert Behavior During Therapy: WFL for tasks assessed/performed, Impulsive Overall Cognitive Status: Within Functional Limits for tasks assessed                                          General Comments      Exercises     Assessment/Plan    PT Assessment Patient needs continued PT services  PT Problem List Decreased strength;Decreased activity tolerance;Decreased balance;Decreased mobility;Decreased knowledge of use of DME;Decreased safety awareness;Decreased knowledge of precautions       PT Treatment Interventions DME instruction;Gait training;Stair training;Functional mobility training;Therapeutic activities;Therapeutic exercise;Balance training;Patient/family education    PT Goals (Current goals can be found in the Care Plan section)  Acute Rehab PT Goals Patient Stated Goal: Home ASAP PT Goal Formulation: With patient Time For Goal Achievement: 07/07/23 Potential to Achieve Goals: Good    Frequency Min 1X/week     Co-evaluation               AM-PAC PT "6 Clicks" Mobility  Outcome Measure Help needed turning from your back to your side while in a flat bed without using bedrails?: None Help needed moving from lying on your back to sitting on the side of a flat bed without using bedrails?: None Help needed moving to and from a bed to a  chair (including a wheelchair)?: A Little Help needed standing up from a chair using your arms (e.g., wheelchair or bedside chair)?: A Little Help needed to walk in hospital room?: A Little Help needed climbing 3-5 steps with a railing? : A Little 6 Click Score: 20    End of Session Equipment Utilized During Treatment: Gait belt Activity Tolerance: Patient tolerated treatment well Patient left: in bed;with call bell/phone within reach;Other (comment) (ECHO tech present in room) Nurse Communication: Mobility status PT Visit Diagnosis: Unsteadiness on feet (R26.81);Other symptoms and signs involving the nervous system (R29.898)    Time: 1315-1330 PT Time Calculation (min) (ACUTE ONLY): 15 min   Charges:   PT Evaluation $PT Eval Low Complexity: 1 Low   PT General Charges $$ ACUTE PT VISIT: 1 Visit         Gerald Whitaker, PT, DPT Acute Rehabilitation Services Secure Chat Preferred Office: [phone]   Gerald Whitaker 06/30/2023, 2:03 PM

## 2023-06-30 NOTE — Consult Note (Signed)
NEUROLOGY CONSULT NOTE   Date of service: June 30, 2023 Patient Name: Gerald Whitaker MRN:  [id number] DOB:  [birth year] Chief Complaint: "falling over to right" Requesting Provider: Darlin Drop, DO  History of Present Illness  Gerald Whitaker is a 77 y.o. male hx of arthritis who presents with legs giving out and falling over to the right.  Went to bed Monday night and woke up Tuesday with feeling off balance and veering to right. Worked in the yard and felt off the entire day. Wednesday AM, felt the same but room spinning. Came in to get checked out. MRI Brain demonstrated a small L pontine stroke.  No prior hx of stroke, mother had stroke. Does not smoke, no EtOh. No hx of DM2, HTN, HLD or afibb.  LKW: 06/26/23 Modified rankin score: 0-Completely asymptomatic and back to baseline post- stroke IV Thrombolysis: not offered, outside window EVT: not offered, outside window.  NIHSS components Score: Comment  1a Level of Conscious 0[x]  1[]  2[]  3[]      1b LOC Questions 0[x]  1[]  2[]       1c LOC Commands 0[x]  1[]  2[]       2 Best Gaze 0[x]  1[]  2[]       3 Visual 0[x]  1[]  2[]  3[]      4 Facial Palsy 0[x]  1[]  2[]  3[]      5a Motor Arm - left 0[x]  1[]  2[]  3[]  4[]  UN[]    5b Motor Arm - Right 0[x]  1[]  2[]  3[]  4[]  UN[]    6a Motor Leg - Left 0[x]  1[]  2[]  3[]  4[]  UN[]    6b Motor Leg - Right 0[x]  1[]  2[]  3[]  4[]  UN[]    7 Limb Ataxia 0[x]  1[]  2[]  3[]  UN[]     8 Sensory 0[x]  1[]  2[]  UN[]      9 Best Language 0[x]  1[]  2[]  3[]      10 Dysarthria 0[x]  1[]  2[]  UN[]      11 Extinct. and Inattention 0[x]  1[]  2[]       TOTAL: 0      ROS  Comprehensive ROS performed and pertinent positives documented in HPI  Past History   Past Medical History:  Diagnosis Date   Arthritis     Past Surgical History:  Procedure Laterality Date   TOTAL HIP ARTHROPLASTY Left 09/30/2019   Procedure: LEFT TOTAL HIP ARTHROPLASTY ANTERIOR APPROACH;  Surgeon: Tarry Kos, MD;  Location: [facility] OR;  Service: Orthopedics;   Laterality: Left;   TOTAL HIP ARTHROPLASTY Right 12/02/2019   Procedure: RIGHT TOTAL HIP ARTHROPLASTY ANTERIOR APPROACH;  Surgeon: Tarry Kos, MD;  Location: [facility] OR;  Service: Orthopedics;  Laterality: Right;    Family History: History reviewed. No pertinent family history.  Social History  reports that he quit smoking about 40 years ago. His smoking use included cigarettes. He has never used smokeless tobacco. He reports that he does not currently use alcohol. He reports that he does not use drugs.  Allergies  Allergen Reactions   Other Other (See Comments)    NO BLOOD PRODUCTS   Aspirin Nausea And Vomiting    GI Intolerance    Medications   Current Facility-Administered Medications:    acetaminophen (TYLENOL) tablet 650 mg, 650 mg, Oral, Q6H PRN, Hall, Carole N, DO   aspirin EC tablet 81 mg, 81 mg, Oral, Daily, Dow Adolph N, DO, 81 mg at 06/29/23 2133   atorvastatin (LIPITOR) tablet 80 mg, 80 mg, Oral, Daily, Darlin Drop, DO, 80 mg at 06/29/23 2132   clopidogrel (PLAVIX) tablet 75 mg, 75  mg, Oral, Daily, Erick Blinks, MD, 75 mg at 06/29/23 2132   labetalol (NORMODYNE) injection 5 mg, 5 mg, Intravenous, Q2H PRN, Hall, Carole N, DO   melatonin tablet 5 mg, 5 mg, Oral, QHS PRN, Hall, Carole N, DO   polyethylene glycol (MIRALAX / GLYCOLAX) packet 17 g, 17 g, Oral, Daily PRN, Hall, Carole N, DO   prochlorperazine (COMPAZINE) injection 5 mg, 5 mg, Intravenous, Q6H PRN, Darlin Drop, DO  Vitals   Vitals:   06/29/23 2100 06/29/23 2145 06/29/23 2201 06/30/23 0019  BP: (!) 158/83  (!) 153/90 139/70  Pulse: 60  (!) 53 (!) 51  Resp: 16  16 16   Temp:  (!) 97.5 F (36.4 C) 98 F (36.7 C) 98.1 F (36.7 C)  TempSrc:  Oral Axillary Oral  SpO2: 100%  96% 99%  Weight:   91.9 kg   Height:   6' (1.829 m)     Body mass index is 27.48 kg/m.  Physical Exam   Constitutional: Appears well-developed and well-nourished.  Psych: Affect appropriate to situation.  Eyes: No  scleral injection.  HENT: No OP obstruction.  Head: Normocephalic.  Cardiovascular: Normal rate and regular rhythm.  Respiratory: Effort normal, non-labored breathing.  GI: Soft.  No distension. There is no tenderness.  Skin: WDI.   Neurologic Examination  Mental status/Cognition: Alert, oriented to self, place, month and year, good attention.  Speech/language: Fluent, comprehension intact, object naming intact, repetition intact.  Cranial nerves:   CN II Pupils equal and reactive to light, no VF deficits    CN III,IV,VI EOM intact, no gaze preference or deviation, no nystagmus    CN V normal sensation in V1, V2, and V3 segments bilaterally    CN VII no asymmetry, no nasolabial fold flattening    CN VIII normal hearing to speech    CN IX & X normal palatal elevation, no uvular deviation    CN XI 5/5 head turn and 5/5 shoulder shrug bilaterally    CN XII midline tongue protrusion    Motor:  Muscle bulk: normal, tone normal, pronator drift none tremor none Mvmt Root Nerve  Muscle Right Left Comments  SA C5/6 Ax Deltoid 5 5   EF C5/6 Mc Biceps 5 5   EE C6/7/8 Rad Triceps 5 5   WF C6/7 Med FCR     WE C7/8 PIN ECU     F Ab C8/T1 U ADM/FDI 5 5   HF L1/2/3 Fem Illopsoas 4+ 5   KE L2/3/4 Fem Quad 5 5   DF L4/5 D Peron Tib Ant 5 5   PF S1/2 Tibial Grc/Sol 5 5    Sensation:  Light touch Intact throughout   Pin prick    Temperature    Vibration   Proprioception    Coordination/Complex Motor:  - Finger to Nose intact BL - Heel to shin intact BL - Rapid alternating movement are normal - Gait: deferred for patient safety.   Labs   CBC:  Recent Labs  Lab 06/29/23 1422  WBC 3.7*  HGB 13.5  HCT 41.2  MCV 92.2  PLT 131*    Basic Metabolic Panel:  Lab Results  Component Value Date   NA 138 06/29/2023   K 4.3 06/29/2023   CO2 25 06/29/2023   GLUCOSE 100 (H) 06/29/2023   BUN 11 06/29/2023   CREATININE 1.15 06/29/2023   CALCIUM 9.5 06/29/2023   GFRNONAA >60  06/29/2023   GFRAA 57 (L) 12/03/2019   Lipid Panel: No  results found for: "LDLCALC" HgbA1c: No results found for: "HGBA1C" Urine Drug Screen: No results found for: "LABOPIA", "COCAINSCRNUR", "LABBENZ", "AMPHETMU", "THCU", "LABBARB"  Alcohol Level No results found for: "ETH" INR  Lab Results  Component Value Date   INR 1.1 11/28/2019   APTT  Lab Results  Component Value Date   APTT 30 11/28/2019   AED levels: No results found for: "PHENYTOIN", "ZONISAMIDE", "LAMOTRIGINE", "LEVETIRACETA"  CT angio Head and Neck with contrast(Personally reviewed): Moderate to severe R P2 stenosis.  MRI Brain(Personally reviewed): L pontine stroke  Impression   Gerald Whitaker is a 77 y.o. male with L pontine stroke.  Recommendations  - Frequent Neuro checks per stroke unit protocol - Recommend obtaining TTE  - Recommend obtaining Lipid panel with LDL - Please start statin if LDL > 70 - Recommend HbA1c to evaluate for diabetes and how well it is controlled. - Antithrombotic - Aspirin 81mg  daily along with plavix 75mg  daily x 21 days, followed by Aspirin 81mg  daily alone. - Recommend DVT ppx - SBP goal - aim for gradual normotension - Recommend Telemetry monitoring for arrythmia - Recommend bedside swallow screen prior to PO intake. - Stroke education booklet - Recommend PT/OT/SLP consult  ______________________________________________________________________    Welton Flakes [facility]

## 2023-06-30 NOTE — Plan of Care (Signed)
  Problem: Pain Managment: Goal: General experience of comfort will improve Outcome: Progressing   Problem: Safety: Goal: Ability to remain free from injury will improve Outcome: Progressing   Problem: Skin Integrity: Goal: Risk for impaired skin integrity will decrease Outcome: Progressing   Problem: Education: Goal: Knowledge of disease or condition will improve Outcome: Progressing   Problem: Ischemic Stroke/TIA Tissue Perfusion: Goal: Complications of ischemic stroke/TIA will be minimized Outcome: Progressing   Problem: Coping: Goal: Will identify appropriate support needs Outcome: Progressing   Problem: Health Behavior/Discharge Planning: Goal: Goals will be collaboratively established with patient/family Outcome: Progressing   Problem: Self-Care: Goal: Ability to participate in self-care as condition permits will improve Outcome: Progressing

## 2023-06-30 NOTE — Evaluation (Signed)
Occupational Therapy Evaluation Patient Details Name: Gerald Whitaker MRN: [id number] DOB: 08-Jul-1946 Today's Date: 06/30/2023   History of Present Illness 77 y.o. male who presents with legs giving out and falling over to the right. MRI Brain demonstrated a small L pontine stroke. PMH: arthritis, B THA.   Clinical Impression   PTA pt lives independently with his wife and son, does not drive, but is very active and enjoys gardening.  Pt with balance deficits, with R bias at times, however able to ambulate and complete ADL tasks with S @ RW level.  Recommend follow up with neuro outpt OT. Reviewed strategies to reduce risk of falls. Educated pt on warning sign/symptoms of CVA using Be[employer].       If plan is discharge home, recommend the following: A little help with bathing/dressing/bathroom;Assistance with cooking/housework;Assist for transportation    Functional Status Assessment  Patient has had a recent decline in their functional status and demonstrates the ability to make significant improvements in function in a reasonable and predictable amount of time.  Equipment Recommendations  Other (comment) (RW)    Recommendations for Other Services PT consult     Precautions / Restrictions Precautions Precautions: Fall      Mobility Bed Mobility Overal bed mobility: Independent                  Transfers Overall transfer level: Needs assistance Equipment used: Rolling walker (2 wheels) Transfers: Sit to/from Stand Sit to Stand: Supervision                  Balance Overall balance assessment: Needs assistance   Sitting balance-Leahy Scale: Good       Standing balance-Leahy Scale: Fair                             ADL either performed or assessed with clinical judgement   ADL Overall ADL's : Needs assistance/impaired Eating/Feeding: Set up                                   Functional mobility during ADLs:  Supervision/safety;Rolling walker (2 wheels);Cueing for safety General ADL Comments: overall set up/S @ RW level; educated on strategies to reduce risk of falls     Vision Baseline Vision/History: 1 Wears glasses Vision Assessment?: Yes;No apparent visual deficits     Perception Perception: Within Functional Limits       Praxis Praxis: WFL       Pertinent Vitals/Pain Pain Assessment Pain Assessment: No/denies pain     Extremity/Trunk Assessment Upper Extremity Assessment Upper Extremity Assessment: Overall WFL for tasks assessed (hx of trigger finger L middle and thumb tingling/numbness R hand)   Lower Extremity Assessment Lower Extremity Assessment: Defer to PT evaluation   Cervical / Trunk Assessment Cervical / Trunk Assessment: Other exceptions (R bias)   Communication Communication Communication: Hearing impairment   Cognition Arousal: Alert Behavior During Therapy: WFL for tasks assessed/performed Overall Cognitive Status: Within Functional Limits for tasks assessed                                       General Comments       Exercises     Shoulder Instructions      Home Living Family/patient expects to be discharged to:: Private residence  Living Arrangements: Spouse/significant other;Children Available Help at Discharge: Family;Available 24 hours/day Type of Home: House Home Access: Stairs to enter [employer] of Steps: 4 Entrance Stairs-Rails: Right;Left;Can reach both Home Layout: Two level;Able to live on main level with bedroom/bathroom;Laundry or work area in basement Alternate Teacher, music of Steps: flight Alternate Level Stairs-Rails: Right Bathroom Shower/Tub: Producer, television/film/video: Standard Bathroom Accessibility: Yes How Accessible: Accessible via walker Home Equipment: Agricultural consultant (2 wheels);Cane - single point          Prior Functioning/Environment Prior Level of Function :  Independent/Modified Independent;Other (comment) (stays  active; enjoys yardwork)             Mobility Comments: has been staggering and "almost falling for a couple of weeks, but got really bad this past Tuesday          OT Problem List: Decreased strength;Decreased activity tolerance;Impaired balance (sitting and/or standing)      OT Treatment/Interventions: Self-care/ADL training;DME and/or AE instruction;Patient/family education    OT Goals(Current goals can be found in the care plan section) Acute Rehab OT Goals Patient Stated Goal: home today OT Goal Formulation: All assessment and education complete, DC therapy  OT Frequency: Min 1X/week    Co-evaluation              AM-PAC OT "6 Clicks" Daily Activity     Outcome Measure Help from another person eating meals?: None Help from another person taking care of personal grooming?: None Help from another person toileting, which includes using toliet, bedpan, or urinal?: A Little Help from another person bathing (including washing, rinsing, drying)?: A Little Help from another person to put on and taking off regular upper body clothing?: A Little Help from another person to put on and taking off regular lower body clothing?: A Little 6 Click Score: 20   End of Session Equipment Utilized During Treatment: Gait belt;Rolling walker (2 wheels) Nurse Communication: Mobility status;Other (comment) (DC needs)  Activity Tolerance: Patient tolerated treatment well Patient left: in bed;with call bell/phone within reach;with bed alarm set  OT Visit Diagnosis: Unsteadiness on feet (R26.81);Other abnormalities of gait and mobility (R26.89);Muscle weakness (generalized) (M62.81)                Time: 2440-1027 OT Time Calculation (min): 23 min Charges:  OT General Charges $OT Visit: 1 Visit OT Evaluation $OT Eval Low Complexity: 1 Low  Lerline Valdivia, OT/L   Acute OT Clinical Specialist Acute Rehabilitation Services Pager  [phone] Office [phone]   [facility] 06/30/2023, 1:17 PM

## 2023-06-30 NOTE — Discharge Summary (Signed)
5      Physician Discharge Summary  Gerald Whitaker NWG:[id number] DOB: June 01, 1946  PCP: [facility]  Admitted from: Home Discharged to: Home  Admit date: 06/29/2023 Discharge date: 06/30/2023  Recommendations for Outpatient Follow-up:    Follow-up Information     [facility]. Schedule an appointment as soon as possible for a visit in 1 week(s).   Why: To be seen with repeat labs (CBC & CMP).  Follow-up on low WBC and platelet counts, thyroid nodule seen on CT scan which needs further evaluation with thyroid ultrasound. Contact information: [address] [facility] [place] [address] [phone]         [facility]. Schedule an appointment as soon as possible for a visit.   Specialty: Rehabilitation Contact information: [address] [place] [address] [phone]                 Home Health: Outpatient PT and OT    Equipment/Devices:     Durable Medical Equipment  (From admission, onward)           Start     Ordered   06/30/23 1458  For home use only DME Walker rolling  Once       Question Answer Comment  Walker: With 5 Inch Wheels   Patient needs a walker to treat with the following condition Weakness      06/30/23 1458   06/30/23 1446  DME Walker  Once       Comments: Rolling walker with 2 wheels per PT recommendations.  Question Answer Comment  Walker: With 5 Inch Wheels   Patient needs a walker to treat with the following condition Acute stroke due to ischemia [facility])      06/30/23 1447             Discharge Condition: Improved and stable.   Code Status: Full Code Diet recommendation:  Discharge Diet Orders (From admission, onward)     Start     Ordered   06/30/23 0000  Diet - low sodium heart healthy        06/30/23 1447             Discharge Diagnoses:  Principal Problem:   CVA (cerebral vascular accident) [facility])   Brief  Summary: 77 year old married male, independent, no known significant past medical history, on several OTC nutritional supplements, arthritis, presented to the ED on 06/29/2023 with complaints of legs giving out and falling over to the right.  As per neurology input, patient went to bed on 10/14 night and woke up on 10/15 with feeling off balance and veering to the right.  He worked in the yard and felt off that entire day.  10/16 AM, felt the same but room spinning.  Came in to get checked out.  MRI brain demonstrated a small left pontine stroke.  Assessment and plan:  Acute left pontine stroke: MRI brain: Acute infarct in the left Hemi pons.  No hemorrhage. CTA head and neck: Moderate to severe stenosis in the proximal and distal right P2.  No intracranial large vessel occlusion.  No hemodynamically significant stenosis in the neck. LDL 96, A1c 5.3 TTE: LVEF 03-47%, mild LVH, grade 2 diastolic dysfunction. Telemetry shows sinus bradycardia with heart rates ranging between 40s-50s.  Asymptomatic. Neurologist consultation appreciated.  Communicated with stroke MD.  Recommendations are for DAPT with aspirin 81 Mg daily + Plavix 75 Mg daily x 21 days followed by aspirin 81  Mg daily alone PT, OT and SLP have evaluated and recommend outpatient PT, OT and a rolling walker with 2 wheels. Outpatient follow-up with neurology.  Hyperlipidemia LDL 96, goal <70 Patient started on atorvastatin 80 mg daily. Follow CMP and fasting lipids periodically as outpatient.  No LFTs done this admission and will add LFTs to this morning's labs.  Asymptomatic sinus bradycardia Not on any rate control medications and these are to be avoided.  Clinically does not appear hypothyroid. Will add TSH to this morning labs and the results can be followed up as outpatient.  Bicytopenia (leukopenia and thrombocytopenia) Stable compared to admission.  Unclear etiology. Follow CBC and further evaluation as  outpatient.  Multiple nodules in the bilateral thyroid lobes Noted on CTA head and neck.  Largest of which measures up to 1.9 cm Nonemergent ultrasound of the thyroid recommended and this can be pursued as outpatient via his PCP.  Multiple OTC nutritional supplements Really do not have any idea regarding what they do or their side effects.  Defer to outpatient management.  Consultations: Neurology   Procedures: None   Discharge Instructions  Discharge Instructions     Ambulatory referral to Neurology   Complete by: As directed    An appointment is requested in approximately: 2 weeks   Ambulatory referral to Occupational Therapy   Complete by: As directed    Ambulatory referral to Physical Therapy   Complete by: As directed    Call MD for:   Complete by: As directed    Recurrent strokelike symptoms.   Diet - low sodium heart healthy   Complete by: As directed    Increase activity slowly   Complete by: As directed         Medication List     TAKE these medications    aspirin EC 81 MG tablet Take 1 tablet (81 mg total) by mouth daily. Swallow whole. Start taking on: July 01, 2023   atorvastatin 80 MG tablet Commonly known as: LIPITOR Take 1 tablet (80 mg total) by mouth daily. Start taking on: July 01, 2023   clopidogrel 75 MG tablet Commonly known as: PLAVIX Take 1 tablet (75 mg total) by mouth daily. Start taking on: July 01, 2023   GRAPESEED EXTRACT PO Take 1 capsule by mouth in the morning, at noon, and at bedtime. Muscadine seed/skin capsule   multivitamin with minerals Tabs tablet Take 2 tablets by mouth daily.   OVER THE COUNTER MEDICATION Take 1 Scoop by mouth See admin instructions. Super Red supplement powder, patient uses weekly   OVER THE COUNTER MEDICATION Take 1 Scoop by mouth See admin instructions. Super Greens supplement powder, patient uses weekly   OVER THE COUNTER MEDICATION Take 3 capsules by mouth daily. Enhanced Oral  Chelation II   OVER THE COUNTER MEDICATION Take 4 sprays by mouth daily. Gluta-Myst   OVER THE COUNTER MEDICATION Take 4 sprays by mouth daily. Xantho-Myst Plus   OVER THE COUNTER MEDICATION Take 1 Scoop by mouth 2 (two) times daily. Gaditana Original Formula   OVER THE COUNTER MEDICATION Take 1 Scoop by mouth daily as needed (immune support). Colloidal Silver Supplemental Powder. Mix in shake and drink once daily and as needed for immune support       Allergies  Allergen Reactions   Other Other (See Comments)    NO BLOOD PRODUCTS   Aspirin Nausea And Vomiting    GI Intolerance      Procedures/Studies: ECHOCARDIOGRAM COMPLETE  Result Date: 06/30/2023  ECHOCARDIOGRAM REPORT   Patient Name:   Gerald Whitaker Date of Exam: 06/30/2023 Medical Rec #:  [id number]      Height:       72.0 in Accession #:    [id number]     Weight:       202.6 lb Date of Birth:  04-06-1946       BSA:          2.142 m Patient Age:    22 years       BP:           140/72 mmHg Patient Gender: M              HR:           65 bpm. Exam Location:  Inpatient Procedure: 2D Echo, Cardiac Doppler and Color Doppler Indications:   Stroke I63.9  History:       Patient has no prior history of Echocardiogram examinations.                Stroke.  Sonographer:   Darlys Gales Referring      [phone] Amneet Cendejas D Jernee Murtaugh Phys: IMPRESSIONS  1. Left ventricular ejection fraction, by estimation, is 60 to 65%. The left ventricle has normal function. The left ventricle has no regional wall motion abnormalities. There is mild left ventricular hypertrophy. Left ventricular diastolic parameters are consistent with Grade II diastolic dysfunction (pseudonormalization). Elevated left atrial pressure.  2. Right ventricular systolic function is normal. The right ventricular size is normal. Tricuspid regurgitation signal is inadequate for assessing PA pressure.  3. The mitral valve is normal in structure. No evidence of mitral valve regurgitation.  4.  The aortic valve is tricuspid. Aortic valve regurgitation is not visualized. No aortic stenosis is present.  5. The inferior vena cava is normal in size with greater than 50% respiratory variability, suggesting right atrial pressure of 3 mmHg. FINDINGS  Left Ventricle: Left ventricular ejection fraction, by estimation, is 60 to 65%. The left ventricle has normal function. The left ventricle has no regional wall motion abnormalities. The left ventricular internal cavity size was normal in size. There is  mild left ventricular hypertrophy. Left ventricular diastolic parameters are consistent with Grade II diastolic dysfunction (pseudonormalization). Elevated left atrial pressure. Right Ventricle: The right ventricular size is normal. No increase in right ventricular wall thickness. Right ventricular systolic function is normal. Tricuspid regurgitation signal is inadequate for assessing PA pressure. Left Atrium: Left atrial size was normal in size. Right Atrium: Right atrial size was normal in size. Pericardium: There is no evidence of pericardial effusion. Mitral Valve: The mitral valve is normal in structure. No evidence of mitral valve regurgitation. Tricuspid Valve: The tricuspid valve is normal in structure. Tricuspid valve regurgitation is trivial. Aortic Valve: The aortic valve is tricuspid. Aortic valve regurgitation is not visualized. No aortic stenosis is present. Aortic valve mean gradient measures 2.0 mmHg. Aortic valve peak gradient measures 2.3 mmHg. Aortic valve area, by VTI measures 2.90 cm. Pulmonic Valve: The pulmonic valve was not well visualized. Pulmonic valve regurgitation is trivial. Aorta: The aortic root is normal in size and structure. Venous: The inferior vena cava is normal in size with greater than 50% respiratory variability, suggesting right atrial pressure of 3 mmHg. IAS/Shunts: The interatrial septum was not well visualized.  LEFT VENTRICLE PLAX 2D LVIDd:         5.00 cm   Diastology  LVIDs:         2.80 cm  LV e' medial:    4.46 cm/s LV PW:         1.20 cm   LV E/e' medial:  16.6 LV IVS:        1.00 cm   LV e' lateral:   7.72 cm/s LVOT diam:     2.00 cm   LV E/e' lateral: 9.6 LV SV:         58 LV SV Index:   27 LVOT Area:     3.14 cm  RIGHT VENTRICLE RV S prime:     12.50 cm/s TAPSE (M-mode): 2.1 cm LEFT ATRIUM             Index        RIGHT ATRIUM           Index LA Vol (A2C):   46.6 ml 21.75 ml/m  RA Area:     14.20 cm LA Vol (A4C):   47.4 ml 22.13 ml/m  RA Volume:   31.60 ml  14.75 ml/m LA Biplane Vol: 46.9 ml 21.89 ml/m  AORTIC VALVE AV Area (Vmax):    3.26 cm AV Area (Vmean):   2.94 cm AV Area (VTI):     2.90 cm AV Vmax:           76.40 cm/s AV Vmean:          61.200 cm/s AV VTI:            0.199 m AV Peak Grad:      2.3 mmHg AV Mean Grad:      2.0 mmHg LVOT Vmax:         79.20 cm/s LVOT Vmean:        57.300 cm/s LVOT VTI:          0.184 m LVOT/AV VTI ratio: 0.92  AORTA Ao Root diam: 3.50 cm MITRAL VALVE MV Area (PHT): 2.65 cm    SHUNTS MV Decel Time: 286 msec    Systemic VTI:  0.18 m MV E velocity: 73.90 cm/s  Systemic Diam: 2.00 cm MV A velocity: 58.70 cm/s MV E/A ratio:  1.26 Epifanio Lesches MD Electronically signed by Epifanio Lesches MD Signature Date/Time: 06/30/2023/2:58:30 PM    Final    CT ANGIO HEAD NECK W WO CM  Result Date: 06/29/2023 CLINICAL DATA:  Balance issues, falling EXAM: CT ANGIOGRAPHY HEAD AND NECK WITH AND WITHOUT CONTRAST TECHNIQUE: Multidetector CT imaging of the head and neck was performed using the standard protocol during bolus administration of intravenous contrast. Multiplanar CT image reconstructions and MIPs were obtained to evaluate the vascular anatomy. Carotid stenosis measurements (when applicable) are obtained utilizing NASCET criteria, using the distal internal carotid diameter as the denominator. RADIATION DOSE REDUCTION: This exam was performed according to the departmental dose-optimization program which includes automated  exposure control, adjustment of the mA and/or kV according to patient size and/or use of iterative reconstruction technique. CONTRAST:  75mL OMNIPAQUE IOHEXOL 350 MG/ML SOLN COMPARISON:  06/29/2023 MRI head, no prior CTA head and neck FINDINGS: CT HEAD FINDINGS Brain: The acute infarct in the left hemi pons noted on the same-day MRI is not particularly distinct on CT. No evidence of additional acute infarct, hemorrhage, mass, mass effect, or midline shift. No hydrocephalus or extra-axial fluid collection. Mildly prominent extra-axial CSF spaces. Vascular: No hyperdense vessel. Skull: Negative for fracture or focal lesion. Sinuses/Orbits: No acute finding. Other: The mastoid air cells are well aerated. CTA NECK FINDINGS Aortic arch: Standard branching. Imaged portion shows no evidence of aneurysm  or dissection. No significant stenosis of the major arch vessel origins. Right carotid system: No evidence of dissection, occlusion, or hemodynamically significant stenosis (greater than 50%). Left carotid system: No evidence of dissection, occlusion, or hemodynamically significant stenosis (greater than 50%). Vertebral arteries: No evidence of dissection, occlusion, or hemodynamically significant stenosis (greater than 50%). Skeleton: No acute osseous abnormality. Degenerative changes in the cervical spine. Other neck: Multiple nodules in the bilateral thyroid lobes, the largest of which measures up to 1.9 cm. Upper chest: No focal pulmonary opacity or pleural effusion. Emphysema. Review of the MIP images confirms the above findings CTA HEAD FINDINGS Anterior circulation: Both internal carotid arteries are patent to the termini, without significant stenosis. A1 segments patent. Normal anterior communicating artery. Anterior cerebral arteries are patent to their distal aspects without significant stenosis. No M1 stenosis or occlusion. MCA branches perfused to their distal aspects without significant stenosis. Posterior  circulation: Vertebral arteries patent to the vertebrobasilar junction without significant stenosis. Posterior inferior cerebellar arteries patent proximally. Basilar patent to its distal aspect without significant stenosis. Superior cerebellar arteries patent proximally. Patent P1 segments. Moderate to severe stenosis in the proximal and distal right P2 (series 9, images 251 and 258). PCAs otherwise perfused to their distal aspects without significant stenosis. The bilateral posterior communicating arteries are diminutive but patent. Venous sinuses: As permitted by contrast timing, patent. Anatomic variants: None significant. No evidence of aneurysm or vascular malformation. Review of the MIP images confirms the above findings IMPRESSION: 1. Moderate to severe stenosis in the proximal and distal right P2. No intracranial large vessel occlusion. 2. No hemodynamically significant stenosis in the neck. 3. Multiple nodules in the bilateral thyroid lobes, the largest of which measures up to 1.9 cm. If this has not previously been evaluated, a non-emergent ultrasound of the thyroid is recommended. (Reference: J Am Coll Radiol. 2015 Feb;12(2): 143-50) Electronically Signed   By: Wiliam Ke M.D.   On: 06/29/2023 23:54   MR Brain Wo Contrast (neuro protocol)  Result Date: 06/29/2023 CLINICAL DATA:  Neuro deficit, acute, stroke suspected EXAM: MRI HEAD WITHOUT CONTRAST TECHNIQUE: Multiplanar, multiecho pulse sequences of the brain and surrounding structures were obtained without intravenous contrast. COMPARISON:  None Available. FINDINGS: Brain: Acute infarct in the left hemi pons. No hemorrhage. No hydrocephalus. No extra-axial fluid collection. No mass effect. No mass lesion. Chronic infarcts in the left parietal lobe. Vascular: Normal flow voids. Skull and upper cervical spine: Normal marrow signal. Sinuses/Orbits: No middle ear or mastoid effusion. Paranasal sinuses are clear. Orbits are unremarkable. Other:  None. IMPRESSION: Acute infarct in the left hemi pons. No hemorrhage. Electronically Signed   By: Lorenza Cambridge M.D.   On: 06/29/2023 20:21      Subjective: Seen this morning.  Denied complaints.  Anxious to return home.  Discharge Exam:  Vitals:   06/30/23 0019 06/30/23 0435 06/30/23 0828 06/30/23 1115  BP: 139/70 135/71 135/72 (!) 140/72  Pulse: (!) 51 (!) 48 (!) 43 (!) 48  Resp: 16 14 16 16   Temp: 98.1 F (36.7 C) 98 F (36.7 C) 98.2 F (36.8 C) 97.6 F (36.4 C)  TempSrc: Oral Oral Oral Oral  SpO2: 99% 100% 100% 100%  Weight:      Height:        General: Pleasant elderly gentleman, moderately built and nourished lying comfortably supine in bed without distress. Cardiovascular: S1 & S2 heard, RRR, S1/S2 +. No murmurs, rubs, gallops or clicks. No JVD or pedal edema.  Telemetry personally reviewed:  SB in the low 40s-low 50s. Respiratory: Clear to auscultation without wheezing, rhonchi or crackles. No increased work of breathing. Abdominal:  Non distended, non tender & soft. No organomegaly or masses appreciated. Normal bowel sounds heard. CNS: Alert and oriented. ??  Right pronator drift.  Finger-nose and heel-to-shin testing bilaterally normal. Extremities: no edema, no cyanosis    The results of significant diagnostics from this hospitalization (including imaging, microbiology, ancillary and laboratory) are listed below for reference.     Microbiology: No results found for this or any previous visit (from the past 240 hour(s)).   Labs: CBC: Recent Labs  Lab 06/29/23 1422 06/30/23 0843  WBC 3.7* 3.6*  HGB 13.5 13.9  HCT 41.2 40.9  MCV 92.2 88.3  PLT 131* 120*    Basic Metabolic Panel: Recent Labs  Lab 06/29/23 1422 06/30/23 0843  NA 138 138  K 4.3 3.8  CL 107 104  CO2 25 28  GLUCOSE 100* 93  BUN 11 11  CREATININE 1.15 1.13  CALCIUM 9.5 9.5  MG  --  2.0  PHOS  --  2.8    Liver Function Tests: No results for input(s): "AST", "ALT", "ALKPHOS",  "BILITOT", "PROT", "ALBUMIN" in the last 168 hours.  CBG: No results for input(s): "GLUCAP" in the last 168 hours.  Hgb A1c Recent Labs    06/30/23 0843  HGBA1C 5.3    Lipid Profile Recent Labs    06/30/23 0843  CHOL 158  HDL 45  LDLCALC 96  TRIG 84  CHOLHDL 3.5     Urinalysis    Component Value Date/Time   COLORURINE YELLOW 06/29/2023 1402   APPEARANCEUR CLEAR 06/29/2023 1402   LABSPEC 1.005 06/29/2023 1402   PHURINE 7.0 06/29/2023 1402   GLUCOSEU NEGATIVE 06/29/2023 1402   HGBUR NEGATIVE 06/29/2023 1402   BILIRUBINUR NEGATIVE 06/29/2023 1402   KETONESUR NEGATIVE 06/29/2023 1402   PROTEINUR NEGATIVE 06/29/2023 1402   NITRITE NEGATIVE 06/29/2023 1402   LEUKOCYTESUR NEGATIVE 06/29/2023 1402    I called patient's spouse and discussed his care in detail.  Updated all care and answered questions.  Advised her regarding above recommendations to follow-up with patient's PCP regarding repeat labs and the thyroid ultrasound.  She verbalized understanding.  Time coordinating discharge: 35 minutes  SIGNED:  Marcellus Scott, MD,  FACP, [facility], [facility], [facility]   [facility] [place]     To contact the attending provider between 7A-7P or the covering provider during after hours 7P-7A, please log into the web site [email or web address] and access using universal [place] password for that web site. If you do not have the password, please call the hospital operator.

## 2023-06-30 NOTE — Plan of Care (Signed)
[facility] Tonsillectomy/Adenoidectomy, Postoperative PEDS care plan entered in error.

## 2023-06-30 NOTE — TOC Transition Note (Signed)
Transition of Care [facility]) - CM/SW Discharge Note   Patient Details  Name: Gerald Whitaker MRN: [id number] Date of Birth: [birth year]  Transition of Care [facility]) CM/SW Contact:  Kermit Balo, RN Phone Number: 06/30/2023, 3:00 PM   Clinical Narrative:     Pt is discharging home with outpatient therapy through [facility]. Information on the AVS.  Walker ordered through [employer] and will be delivered to the room. Pt has transportation home.  Final next level of care: OP Rehab Barriers to Discharge: Continued Medical Work up   Patient Goals and CMS Choice   Choice offered to / list presented to : Patient  Discharge Placement                         Discharge Plan and Services Additional resources added to the After Visit Summary for                  DME Arranged: Walker rolling DME Agency: [employer] Date DME Agency Contacted: 06/30/23   Representative spoke with at DME Agency: Timothy Lasso            Social Determinants of Health (SDOH) Interventions SDOH Screenings   Food Insecurity: No Food Insecurity (06/29/2023)  Housing: Low Risk  (06/29/2023)  Transportation Needs: No Transportation Needs (06/29/2023)  Utilities: Not At Risk (06/29/2023)  Social Connections: Unknown (01/24/2022)   Received from [facility], [facility]  Tobacco Use: Medium Risk (06/29/2023)     Readmission Risk Interventions     No data to display

## 2023-06-30 NOTE — Progress Notes (Signed)
DISCHARGE NOTE HOME Tytan L Hise to be discharged Home per MD order. Discussed prescriptions and follow up appointments with the patient. Prescriptions given to patient; medication list explained in detail. Patient verbalized understanding.  Skin clean, dry and intact without evidence of skin break down, no evidence of skin tears noted. IV catheter discontinued intact. Site without signs and symptoms of complications. Dressing and pressure applied. Pt denies pain at the site currently. No complaints noted.  Patient free of lines, drains, and wounds.   An After Visit Summary (AVS) was printed and given to the patient. Patient escorted via wheelchair, and discharged home via private auto.  Lorine Bears, RN

## 2023-06-30 NOTE — TOC Initial Note (Signed)
Transition of Care [facility]) - Initial/Assessment Note    Patient Details  Name: Gerald Whitaker MRN: [id number] Date of Birth: [birth year]  Transition of [facility]) CM/SW Contact:    Kermit Balo, RN Phone Number: 06/30/2023, 11:48 AM  Clinical Narrative:                  Pt is from home with his spouse and son. They are not there all the time but can be if needed.  Spouse and son provide needed transportation. Pt states he only takes vitamins at home. No DME.  Awaiting therapy evals.  TOC following.   Expected Discharge Plan:  (to be determined) Barriers to Discharge: Continued Medical Work up   Patient Goals and CMS Choice            Expected Discharge Plan and Services       Living arrangements for the past 2 months: Single Family Home                                      Prior Living Arrangements/Services Living arrangements for the past 2 months: Single Family Home Lives with:: Spouse, Adult Children Patient language and need for interpreter reviewed:: Yes Do you feel safe going back to the place where you live?: Yes        Care giver support system in place?: Yes (comment)   Criminal Activity/Legal Involvement Pertinent to Current Situation/Hospitalization: No - Comment as needed  Activities of Daily Living   ADL Screening (condition at time of admission) Independently performs ADLs?: Yes (appropriate for developmental age) Is the patient deaf or have difficulty hearing?: Yes Does the patient have difficulty seeing, even when wearing glasses/contacts?: No Does the patient have difficulty concentrating, remembering, or making decisions?: No  Permission Sought/Granted                  Emotional Assessment Appearance:: Appears stated age Attitude/Demeanor/Rapport: Engaged Affect (typically observed): Accepting Orientation: : Oriented to Self, Oriented to Place, Oriented to  Time, Oriented to Situation   Psych Involvement: No  (comment)  Admission diagnosis:  CVA (cerebral vascular accident) (HCC) [I63.9] Left pontine stroke [facility]) [I63.9] Patient Active Problem List   Diagnosis Date Noted   CVA (cerebral vascular accident) (HCC) 06/29/2023   Status post total replacement of right hip 12/02/2019   Status post total replacement of left hip 09/30/2019   Primary osteoarthritis of right hip 08/23/2019   PCP:  [facility] Pharmacy:   [id number] [place], [place] - [address] [place] [address] Phone: [phone] Fax: [phone]     Social Determinants of Health (SDOH) Social History: SDOH Screenings   Food Insecurity: No Food Insecurity (06/29/2023)  Housing: Low Risk  (06/29/2023)  Transportation Needs: No Transportation Needs (06/29/2023)  Utilities: Not At Risk (06/29/2023)  Social Connections: Unknown (01/24/2022)   Received from [facility]  Tobacco Use: Medium Risk (06/29/2023)   SDOH Interventions:     Readmission Risk Interventions     No data to display

## 2023-07-06 ENCOUNTER — Encounter: Payer: Self-pay | Admitting: Physical Therapy

## 2023-07-06 ENCOUNTER — Ambulatory Visit: Payer: Medicare PPO | Admitting: Occupational Therapy

## 2023-07-06 ENCOUNTER — Ambulatory Visit: Payer: Medicare PPO | Attending: Internal Medicine | Admitting: Physical Therapy

## 2023-07-06 ENCOUNTER — Other Ambulatory Visit: Payer: Self-pay

## 2023-07-06 DIAGNOSIS — R2689 Other abnormalities of gait and mobility: Secondary | ICD-10-CM | POA: Diagnosis not present

## 2023-07-06 DIAGNOSIS — M6281 Muscle weakness (generalized): Secondary | ICD-10-CM | POA: Diagnosis not present

## 2023-07-06 DIAGNOSIS — I639 Cerebral infarction, unspecified: Secondary | ICD-10-CM | POA: Diagnosis not present

## 2023-07-06 DIAGNOSIS — R2681 Unsteadiness on feet: Secondary | ICD-10-CM | POA: Diagnosis not present

## 2023-07-06 NOTE — Therapy (Signed)
OUTPATIENT PHYSICAL THERAPY NEURO EVALUATION   Patient Name: Gerald Whitaker MRN: [id number] DOB:24-Oct-1945, 77 y.o., male Today's Date: 07/07/2023   [facility]P: [facility] REFERRING PROVIDER: Elease Etienne, MD   END OF SESSION:  PT End of Session - 07/07/23 0933     Visit Number 1    Number of Visits 1   eval only   Authorization Type [employer]    PT Start Time 1017    PT Stop Time 1100    PT Time Calculation (min) 43 min    Activity Tolerance Patient tolerated treatment well    Behavior During Therapy WFL for tasks assessed/performed             Past Medical History:  Diagnosis Date   Arthritis    Past Surgical History:  Procedure Laterality Date   TOTAL HIP ARTHROPLASTY Left 09/30/2019   Procedure: LEFT TOTAL HIP ARTHROPLASTY ANTERIOR APPROACH;  Surgeon: Tarry Kos, MD;  Location: [facility] OR;  Service: Orthopedics;  Laterality: Left;   TOTAL HIP ARTHROPLASTY Right 12/02/2019   Procedure: RIGHT TOTAL HIP ARTHROPLASTY ANTERIOR APPROACH;  Surgeon: Tarry Kos, MD;  Location: [facility] OR;  Service: Orthopedics;  Laterality: Right;   Patient Active Problem List   Diagnosis Date Noted   CVA (cerebral vascular accident) (HCC) 06/29/2023   Status post total replacement of right hip 12/02/2019   Status post total replacement of left hip 09/30/2019   Primary osteoarthritis of right hip 08/23/2019    ONSET DATE: 06-29-23  REFERRING DIAG:  Diagnosis  I63.9 (ICD-10-CM) - Left pontine stroke (HCC)    THERAPY DIAG:  Other abnormalities of gait and mobility - Plan: PT plan of care cert/re-cert  Unsteadiness on feet - Plan: PT plan of care cert/re-cert  Rationale for Evaluation and Treatment: Rehabilitation  SUBJECTIVE:                                                                                                                                                                                             SUBJECTIVE STATEMENT: Pt presented to [facility] ED on  06-29-23 with Rt sided weakness and falling to the right.  Pt was admitted to [facility] 06-29-23 - 06-30-23 with diagnosis of Lt pontine CVA.  Pt reports he has significantly improved - was discharged from hospital with RW but states he is no longer using this device.  Pt presents to PT eval amb. Independently with only c/o mild weakness in the morning upon getting out of bed, but pt states this resolves and strength increases in his legs as he gets up and gets moving. Pt reports amb. Outside  in his yard without difficulty at this time.  Pt just had OT eval prior to this scheduled PT eval and OT is not warranted.  Pt accompanied by: self  PERTINENT HISTORY: s/p Lt THA Jan. 2021, s/p Rt THA March 2021, Lt pontine CVA 06-29-23  PAIN:  Are you having pain? No  PRECAUTIONS: None  RED FLAGS: None   WEIGHT BEARING RESTRICTIONS: No  FALLS: Has patient fallen in last 6 months? No  LIVING ENVIRONMENT: Lives with: lives with their spouse Lives in: House/apartment Stairs: Yes: External: 5 steps; is having 2 rails installed (planned prior to CVA) Has following equipment at home: Walker - 2 wheeled  PLOF: Independent and pt states he did not drive prior to CVA  PATIENT GOALS: Regain strength in RLE so "I won't be wobbling"  OBJECTIVE:  Note: Objective measures were completed at Evaluation unless otherwise noted.  DIAGNOSTIC FINDINGS: Brain: The acute infarct in the left hemi pons noted on the same-day MRI is not particularly distinct on CT. No evidence of additional acute infarct, hemorrhage, mass, mass effect, or midline shift. No hydrocephalus or extra-axial fluid collection. Mildly prominent extra-axial CSF spaces.  COGNITION: Overall cognitive status: Within functional limits for tasks assessed   SENSATION: WFL  COORDINATION: WFL's  POSTURE: No Significant postural limitations  LOWER EXTREMITY ROM:  WFL's bil. LE's   LOWER EXTREMITY MMT:   MMT Right Eval Left Eval  Hip flexion  5 5  Hip extension Decline lying prone   Hip abduction 4   Hip adduction    Hip internal rotation    Hip external rotation    Knee flexion 5 5  Knee extension 5 5  Ankle dorsiflexion 5 5  Ankle plantarflexion 4   Ankle inversion    Ankle eversion    (Blank rows = not tested)  BED MOBILITY:  Independent  TRANSFERS: Independent Assistive device utilized: None  Sit to stand: Complete Independence Stand to sit: Complete Independence  STAIRS: Level of Assistance: Complete Independence Stair Negotiation Technique: Forwards with No Rails Number of Stairs: 4  Height of Stairs: 6  Comments: No rails used for testing only - pt reports he uses rails at home which is recommended for safety  GAIT: Gait pattern: [facility] Distance walked: 100 Assistive device utilized: None Level of assistance: Complete Independence Comments: no unsteadiness noted    07/06/23 0001  Dynamic Gait Index  Level Surface 3  Change in Gait Speed 3  Gait with Horizontal Head Turns 3  Gait with Vertical Head Turns 3  Gait and Pivot Turn 3  Step Over Obstacle 3  Step Around Obstacles 3  Steps 3  Total Score 24     FUNCTIONAL TESTS:  5 times sit to stand: 14.94 secs without UE support from chair Timed up and go (TUG): 10.22 secs without device 10 meter walk test: 8.63 secs = 3.8 ft/sec without device Pt able to perform SLS on each leg > 10 secs  PATIENT SURVEYS:  FOTO N/A due to eval only  TODAY'S TREATMENT:  DATE: HEP only - no deficits noted which warrant PT intervention   Access Code: [email or web address] URL: [email or web address] Date: 07/07/2023 Prepared by: Maebelle Munroe  Exercises - Sit to Stand  - 1 x daily - 7 x weekly - 1 sets - 10 reps - Single Leg Stance  - 1 x daily - 7 x weekly - 1 sets - 1-2 reps - 10 sec hold hold  PATIENT EDUCATION: Education details:  eval results with HEP issued Person educated: Patient Education method: Explanation, Demonstration, and Handouts Education comprehension: verbalized understanding and returned demonstration  HOME EXERCISE PROGRAM: Medbridge   GOALS:  N/A as eval only  ASSESSMENT:  CLINICAL IMPRESSION: Patient is a 77 y.o. gentleman who was seen today for physical therapy evaluation and treatment for RLE weakness due to s/p Lt pontine CVA on 06-29-23.  RLE strength is WNL's; pt's DGI score is 24/24 with no gait impairments noted at this time.  Pt is independent with ambulation without use of assistive device.  TUG score = 10.22 (no fall risk indicated as score < 13.5 secs).  No deficits noted which warrant skilled PT services at this time - pt agrees that no PT is needed.     OBJECTIVE IMPAIRMENTS:  N/A .   ACTIVITY LIMITATIONS:  N/A  PARTICIPATION LIMITATIONS:  N/A - pt reports that he did not drive prior to the CVA  PERSONAL FACTORS: N/A are also affecting patient's functional outcome.  REHAB POTENTIAL: Good  CLINICAL DECISION MAKING: Stable/uncomplicated  EVALUATION COMPLEXITY: Low  PLAN:  PT FREQUENCY: 1x/week  PT DURATION: 1 week (EVAL only)   PLANNED INTERVENTIONS: [id number]- Self Care only as HEP issued on 07-06-23   PLAN FOR NEXT SESSION: N/A - eval only as no physical deficits noted    Kary Kos, PT 07/07/2023, 10:11 AM

## 2023-07-06 NOTE — Therapy (Signed)
OUTPATIENT OCCUPATIONAL THERAPY NEURO EVALUATION  Patient Name: Gerald Whitaker MRN: [id number] DOB:05/21/1946, 77 y.o., male Today's Date: 07/06/2023  PCP: [facility]  REFERRING PROVIDER: Elease Etienne, MD   END OF SESSION:  OT End of Session - 07/06/23 1502     Visit Number 1    Number of Visits 1   eval only   Authorization Type [employer] Medicare & [employer]    OT Start Time [phone]    OT Stop Time 1015    OT Time Calculation (min) 42 min    Activity Tolerance Patient tolerated treatment well    Behavior During Therapy WFL for tasks assessed/performed             Past Medical History:  Diagnosis Date   Arthritis    Past Surgical History:  Procedure Laterality Date   TOTAL HIP ARTHROPLASTY Left 09/30/2019   Procedure: LEFT TOTAL HIP ARTHROPLASTY ANTERIOR APPROACH;  Surgeon: Tarry Kos, MD;  Location: [facility] OR;  Service: Orthopedics;  Laterality: Left;   TOTAL HIP ARTHROPLASTY Right 12/02/2019   Procedure: RIGHT TOTAL HIP ARTHROPLASTY ANTERIOR APPROACH;  Surgeon: Tarry Kos, MD;  Location: [facility] OR;  Service: Orthopedics;  Laterality: Right;   Patient Active Problem List   Diagnosis Date Noted   CVA (cerebral vascular accident) (HCC) 06/29/2023   Status post total replacement of right hip 12/02/2019   Status post total replacement of left hip 09/30/2019   Primary osteoarthritis of right hip 08/23/2019    ONSET DATE: 06/30/23 (referral date)  REFERRING DIAG: I63.9 (ICD-10-CM) - Left pontine stroke (HCC)   THERAPY DIAG:  Muscle weakness (generalized)  Rationale for Evaluation and Treatment: Rehabilitation  SUBJECTIVE:   SUBJECTIVE STATEMENT: Pt reports he experienced weak legs and leaning to R when stroke occurred and reports "it is a little better." Pt reports weakness in morning, slight pull to R sometimes though it improves throughout the day as he walks. Pt reports he is very active with outdoor yardwork. Pt reports MD told pt "to take it easy for  awhile." Pt stated stroke may have been d/t stress.  Pt accompanied by: self  PERTINENT HISTORY:  07/06/23 - Pt reports B hip replacements in 2021. Per 06/30/23 D/C Summary: arthritis.  Pt "presented to the ED on 06/29/2023 with complaints of legs giving out and falling over to the right.   As per neurology input, patient went to bed on 10/14 night and woke up on 10/15 with feeling off balance and veering to the right.  He worked in the yard and felt off that entire day.  10/16 AM, felt the same but room spinning.  Came in to get checked out.  MRI brain demonstrated a small left pontine stroke."  PRECAUTIONS: None   WEIGHT BEARING RESTRICTIONS: No  PAIN:  Are you having pain? No  FALLS: Has patient fallen in last 6 months? No. Pt reports some loss of balance episodes and "catches" himself on walls or pieces of furniture. Pt reports always being careful and denies any falls.  LIVING ENVIRONMENT: Lives with: lives with their spouse and lives with their son Lives in: House/apartment Stairs: Yes: Internal: 8 steps; Not yet, [facility] in process of placing railings outside. R side rail at side of house.  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 Pt has not driven in last 5 years.  PATIENT GOALS: "I want my leg to get back strong and balance to improve."  OBJECTIVE:  Note: Objective measures were completed at Evaluation unless otherwise noted.  HAND DOMINANCE: Ambidextrous  ADLs: Overall ADLs: ind Transfers/ambulation related to ADLs: Eating: ind, pt reports dropping items more d/t numbness of R thumb for "months" though specified dropping objects is "Not d/t the stroke." Grooming: ind UB Dressing: ind, sometimes difficulty with  buttons d/t numbness of R thumb for "months" though specified dropping objects is "Not d/t the stroke." LB Dressing: ind, completes task seated now d/t balance concerns Toileting: ind Bathing: ind, has person available in home for supervision Tub Shower transfers: ind Equipment: Shower seat with back and Walk in shower  IADLs: Shopping: same as before stroke - wife and son completes grocery shopping Light housekeeping: ind, no difficulty (washing dishes) Meal Prep: ind. Makes simple microwave meals (e.g. soup), no change since stroke - wife and son primarily completes cooking Community mobility: same as before stroke - pt's son and pt's wife drives Medication management: ind Landscape architect: ind Handwriting: 100% legible - cursive and print  Pt reports no difficulty with typing tasks for work.  MOBILITY STATUS: Independent  POSTURE COMMENTS:  Ind for sitting posture, slight forward head posture  ACTIVITY TOLERANCE: Activity tolerance: Pt reports getting tired easily despite limited activity during the day. Pt reports requiring naps "sometimes" during the day.  FUNCTIONAL OUTCOME MEASURES: FOTO: not captured - eval only  UPPER EXTREMITY ROM:     Active ROM Right eval Left eval  Shoulder flexion [facility]  Shoulder abduction    Shoulder adduction    Shoulder extension    Shoulder internal rotation    Shoulder external rotation    Middle trapezius    Lower trapezius    Elbow flexion    Elbow extension    Wrist flexion [facility] WFL  Wrist extension    Wrist ulnar deviation    Wrist radial deviation    Wrist pronation    Wrist supination    (Blank rows = not tested)  ?Contracture of L middle finger - unable to achieve full ext/flex. Pt reports limited ROM of L middle finger has existed "for at least a year" and it "pops" and "hurts" when pt makes composite fist.  BUE - limited wrist ext though functional.  HAND FUNCTION: Grip strength: Right: 87.9 lbs; Left: 53.7  lbs  OT noted pt's L middle finger demo'd decreased ROM. OT educated pt to avoid "pop" sensation of L middle finger during grasp, which therefore limited use of L middle finger during LUE grip strength testing. This likely decreased LUE grip strength score.  COORDINATION: 9 Hole Peg test: Right: 31 sec; Left: 26 sec Box and Blocks:  Right 53blocks, Left 54blocks  SENSATION: Pt reported no changes since stroke. Pt reports "numbness" of R thumb which "has been that way for several months, before the stroke."  EDEMA: None noted  MUSCLE TONE: None noted  COGNITION: Overall cognitive status: Within functional limits for tasks assessed. Pt reports no changes in memory or thinking process. Pt reports difficulty with STM compared to LTM over the past year saying "probably due to aging," but reports no change since the stroke.  VISION: Subjective report: Pt reports no changes. Baseline vision: Wears glasses for reading only Visual history:  Pt reports none.  VISION ASSESSMENT: Not tested  PERCEPTION: Not  tested  PRAXIS: Not tested  OBSERVATIONS: Pt reported participating in an active lifestyle and exercising consistently. Pt was pleasant, agreeable, and appeared well-kept. Pt ambulated ind without A/E. Pt reports having walker and cane available in car though does not use them currently. Pt wearing glasses, but uses glasses for reading only.   TODAY'S TREATMENT:                                                                                                                              DATE:   Eval only  PATIENT EDUCATION: Education details: Stress reduction strategies (e.g. meditation, deep breathing, pacing self, avoiding taking on too many tasks during the day). Energy conservation. OT role. POC.  Person educated: Patient Education method: Explanation Education comprehension: verbalized understanding  HOME EXERCISE PROGRAM: N/A - eval only   GOALS: N/A - eval  only  ASSESSMENT:  CLINICAL IMPRESSION: Patient is a 77 y.o. male who was seen today for occupational therapy evaluation for left pontine stroke. Hx includes bilateral hip replacements, arthritis. Patient currently presents at baseline level of functioning. Pt reported some increased fatigue and balance concerns since stroke though pt is continuing to fully participate independently in all ADLs and IADLs. Pt reported living a healthy and active lifestyle, and recognized understanding of safety strategies during ADL/IADL tasks. Pt reported mild difficulty for eating and buttoning tasks though these difficulties appear unrelated to recent stroke event. OT evaluation only today d/t pt demonstrating functional ADL/IADL skills, maintaining PLOF since stroke, and demonstrating intact BUE strength, coordination, and ROM.  PERFORMANCE DEFICITS: in functional skills including none, cognitive skills including none, and psychosocial skills including none.  IMPAIRMENTS: are limiting patient from  none .   CO-MORBIDITIES: has no other co-morbidities that affects occupational performance. Patient will benefit from skilled OT to address above impairments and improve overall function.  MODIFICATION OR ASSISTANCE TO COMPLETE EVALUATION: No modification of tasks or assist necessary to complete an evaluation.  OT OCCUPATIONAL PROFILE AND HISTORY: Problem focused assessment: Including review of records relating to presenting problem.  CLINICAL DECISION MAKING: LOW - limited treatment options, no task modification necessary  REHAB POTENTIAL: Excellent - eval only  EVALUATION COMPLEXITY: Low    PLAN:  OT FREQUENCY:  N/A  - eval only  OT DURATION: other: N/A - eval only  PLANNED INTERVENTIONS: N/A - eval only  RECOMMENDED OTHER SERVICES: N/A  CONSULTED AND AGREED WITH PLAN OF CARE: OT discussed eval only d/t pt demonstrating high level of ind for ADL and IADL tasks. Pt verbalized understanding and  agreed.  PLAN FOR NEXT SESSION: N/A - eval only   Wynetta Emery, OT 07/06/2023, 3:20 PM

## 2023-07-06 NOTE — Addendum Note (Signed)
Addended by: Carilyn Goodpasture E on: 07/06/2023 03:26 PM   Modules accepted: Orders

## 2023-07-07 NOTE — Progress Notes (Signed)
   07/06/23 0001  Dynamic Gait Index  Level Surface 3  Change in Gait Speed 3  Gait with Horizontal Head Turns 3  Gait with Vertical Head Turns 3  Gait and Pivot Turn 3  Step Over Obstacle 3  Step Around Obstacles 3  Steps 3  Total Score 24

## 2023-07-27 ENCOUNTER — Inpatient Hospital Stay: Payer: No Typology Code available for payment source | Admitting: Adult Health

## 2023-07-31 NOTE — Progress Notes (Unsigned)
[facility] [address] [place]. [place] [address] [phone]       HOSPITAL FOLLOW UP NOTE  Mr. Gerald Whitaker Date of Birth:  December 14, 1945 Medical Record Number:  [id number]   Reason for Referral:  hospital stroke follow up    SUBJECTIVE:   CHIEF COMPLAINT:  No chief complaint on file.   HPI:   Gerald Whitaker is a 77 y.o. who  has a past medical history of Arthritis.  Patient presented on 06/29/2023 with leg weakness, falls, dizziness and leaning to the right for 2 days. MRI showed left pontine infarct. CTA showed moderate to severe right P2 proximal and distal stenosis. Bilateral thyroid nodules noted on CTA. He was started on asa and Plavix for three weeks then asa alone. PT/OT recommended outpatient therapy. Rolling walker ordered in hospital. He was discharged home 06/30/2023. Personally reviewed hospitalization pertinent progress notes, lab work and imaging.  Evaluated by Dr Roda Shutters.   Since discharge,   Outpatient PT/OT?  He continues asa and atorvastatin. BP is   PCP? Bicytopenia? Thyroid nodules   PERTINENT IMAGING/LABS  MRI left pontine infarct CT head and neck moderate to severe right P2 proximal and distal stenosis. 2D Echo EF 60 to 65%   A1C Lab Results  Component Value Date   HGBA1C 5.3 06/30/2023    Lipid Panel     Component Value Date/Time   CHOL 158 06/30/2023 0843   TRIG 84 06/30/2023 0843   HDL 45 06/30/2023 0843   CHOLHDL 3.5 06/30/2023 0843   VLDL 17 06/30/2023 0843   LDLCALC 96 06/30/2023 0843      ROS:   14 system review of systems performed and negative with exception of those listed in HPI  PMH:  Past Medical History:  Diagnosis Date   Arthritis     PSH:  Past Surgical History:  Procedure Laterality Date   TOTAL HIP ARTHROPLASTY Left 09/30/2019   Procedure: LEFT TOTAL HIP ARTHROPLASTY ANTERIOR APPROACH;  Surgeon: Tarry Kos, MD;  Location: [facility] OR;  Service: Orthopedics;  Laterality: Left;   TOTAL HIP  ARTHROPLASTY Right 12/02/2019   Procedure: RIGHT TOTAL HIP ARTHROPLASTY ANTERIOR APPROACH;  Surgeon: Tarry Kos, MD;  Location: [facility] OR;  Service: Orthopedics;  Laterality: Right;    Social History:  Social History   Socioeconomic History   Marital status: Married    Spouse name: Not on file   Number of children: Not on file   Years of education: Not on file   Highest education level: Not on file  Occupational History   Not on file  Tobacco Use   Smoking status: Former    Current packs/day: 0.00    Types: Cigarettes    Quit date: 03/16/1983    Years since quitting: 40.4   Smokeless tobacco: Never  Vaping Use   Vaping status: Never Used  Substance and Sexual Activity   Alcohol use: Not Currently    Comment: rarely   Drug use: Never   Sexual activity: Not on file  Other Topics Concern   Not on file  Social History Narrative   Not on file   Social Determinants of Health   Financial Resource Strain: Not on file  Food Insecurity: No Food Insecurity (06/29/2023)   Hunger Vital Sign    Worried About Running Out of Food in the Last Year: Never true    Ran Out of Food in the Last Year: Never true  Transportation Needs: No Transportation Needs (06/29/2023)  PRAPARE - Administrator, Civil Service (Medical): No    Lack of Transportation (Non-Medical): No  Physical Activity: Not on file  Stress: Not on file  Social Connections: Unknown (01/24/2022)   Received from [facility]   Social Network    Social Network: Not on file  Intimate Partner Violence: Not At Risk (06/29/2023)   Humiliation, Afraid, Rape, and Kick questionnaire    Fear of Current or Ex-Partner: No    Emotionally Abused: No    Physically Abused: No    Sexually Abused: No    Family History: No family history on file.  Medications:   Current Outpatient Medications on File Prior to Visit  Medication Sig Dispense Refill   aspirin EC 81 MG tablet Take 1 tablet (81 mg total) by  mouth daily. Swallow whole. 90 tablet 0   atorvastatin (LIPITOR) 80 MG tablet Take 1 tablet (80 mg total) by mouth daily. 30 tablet 1   clopidogrel (PLAVIX) 75 MG tablet Take 1 tablet (75 mg total) by mouth daily. 19 tablet 0   Multiple Vitamin (MULTIVITAMIN WITH MINERALS) TABS tablet Take 2 tablets by mouth daily.     Nutritional Supplements (GRAPESEED EXTRACT PO) Take 1 capsule by mouth in the morning, at noon, and at bedtime. Muscadine seed/skin capsule     OVER THE COUNTER MEDICATION Take 1 Scoop by mouth See admin instructions. Super Red supplement powder, patient uses weekly     OVER THE COUNTER MEDICATION Take 1 Scoop by mouth See admin instructions. Super Greens supplement powder, patient uses weekly     OVER THE COUNTER MEDICATION Take 3 capsules by mouth daily. Enhanced Oral Chelation II     OVER THE COUNTER MEDICATION Take 4 sprays by mouth daily. Gluta-Myst     OVER THE COUNTER MEDICATION Take 4 sprays by mouth daily. Xantho-Myst Plus     OVER THE COUNTER MEDICATION Take 1 Scoop by mouth 2 (two) times daily. Gaditana Original Formula     OVER THE COUNTER MEDICATION Take 1 Scoop by mouth daily as needed (immune support). Colloidal Silver Supplemental Powder. Mix in shake and drink once daily and as needed for immune support     No current facility-administered medications on file prior to visit.    Allergies:   Allergies  Allergen Reactions   Other Other (See Comments)    NO BLOOD PRODUCTS   Aspirin Nausea And Vomiting    GI Intolerance      OBJECTIVE:  Physical Exam  There were no vitals filed for this visit. There is no height or weight on file to calculate BMI. No results found.      No data to display           General: well developed, well nourished, seated, in no evident distress Head: head normocephalic and atraumatic.   Neck: supple with no carotid or supraclavicular bruits Cardiovascular: regular rate and rhythm, no murmurs Musculoskeletal: no  deformity Skin:  no rash/petichiae Vascular:  Normal pulses all extremities   Neurologic Exam Mental Status: Awake and fully alert.  Fluent speech and language.  Oriented to place and time. Recent and remote memory intact. Attention span, concentration and fund of knowledge appropriate. Mood and affect appropriate.  Cranial Nerves: Fundoscopic exam reveals sharp disc margins. Pupils equal, briskly reactive to light. Extraocular movements full without nystagmus. Visual fields full to confrontation. Hearing intact. Facial sensation intact. Face, tongue, palate moves normally and symmetrically.  Motor: Normal bulk and tone. Normal  strength in all tested extremity muscles Sensory.: intact to touch , pinprick , position and vibratory sensation.  Coordination: Rapid alternating movements normal in all extremities. Finger-to-nose and heel-to-shin performed accurately bilaterally. Gait and Station: Arises from chair without difficulty. Stance is normal. Gait demonstrates normal stride length and balance with ***. Tandem walk and heel toe ***.  Reflexes: 1+ and symmetric.    NIHSS  *** Modified Rankin  ***    ASSESSMENT: Gerald Whitaker is a 77 y.o. year old male presenting with imbalance, falls, dizziness and right sided leaning. Vascular risk factors include HLD, advanced age.      PLAN:  Stroke:  left pontine infarct likely secondary to small vessel disease source: Residual deficit: none. Continue aspirin 81 mg daily  and atorvastatin 80mg  daily for secondary stroke prevention.  Discussed secondary stroke prevention measures and importance of close PCP follow up for aggressive stroke risk factor management. I have gone over the pathophysiology of stroke, warning signs and symptoms, risk factors and their management in some detail with instructions to go to the closest emergency room for symptoms of concern. HTN: BP goal <130/90.  Stable on no HTN agents. Continue to monitor per PCP HLD: LDL goal  <70. Recent LDL 96. Continue atorvastatin 80mg  daily per PCP.  DMII: A1c goal<7.0. Recent A1c 5.3. Not diabetic. Continue healthy well balanced diet and regular exercise.  Bilateral thyroid nodules: continue close follow up with PCP Bicytopenia (leukopenia and thrombocytopenia): continue close follow up with PCP. Consider hematology referral.    Follow up in *** or call earlier if needed   CC:  [facility] provider: Dr. Pearlean Brownie PCP: [facility]    I spent *** minutes of face-to-face and non-face-to-face time with patient.  This included previsit chart review including review of recent hospitalization, lab review, study review, order entry, electronic health record documentation, patient education regarding recent stroke including etiology, secondary stroke prevention measures and importance of managing stroke risk factors, residual deficits and typical recovery time and answered all other questions to patient satisfaction   Shawnie Dapper, [facility]  [facility] [address] [place], [place] [address]  Phone [phone] Fax [phone] Note: This document was prepared with digital dictation and possible smart phrase technology. Any transcriptional errors that result from this process are unintentional.

## 2023-07-31 NOTE — Patient Instructions (Signed)
Below is our plan:  Goals:  1) Maintain strict control of hypertension with blood pressure goal below 130/90 2) Maintain good control of diabetes with hemoglobin A1c goal below 7%  3) Maintain good control of lipids with LDL cholesterol goal below 70 mg/dL.  4) Eat a healthy diet with plenty of whole grains, cereals, fruits and vegetables, exercise regularly and maintain ideal body weight   Resources: https://www.stroke.org/en/about-stroke/stroke-risk-factors/risk-factors-under-your-control  Please make sure you are staying well hydrated. I recommend 50-60 ounces daily. Well balanced diet and regular exercise encouraged. Consistent sleep schedule with 6-8 hours recommended.   Please continue follow up with care team as directed.   Follow up with *** in ***  You may receive a survey regarding today's visit. I encourage you to leave honest feed back as I do use this information to improve patient care. Thank you for seeing me today!    

## 2023-08-01 ENCOUNTER — Inpatient Hospital Stay: Payer: No Typology Code available for payment source | Admitting: Adult Health

## 2023-08-02 ENCOUNTER — Ambulatory Visit (INDEPENDENT_AMBULATORY_CARE_PROVIDER_SITE_OTHER): Payer: Medicare PPO | Admitting: Family Medicine

## 2023-08-02 ENCOUNTER — Encounter: Payer: Self-pay | Admitting: Family Medicine

## 2023-08-02 VITALS — BP 152/78 | HR 78 | Ht 72.0 in | Wt 209.5 lb

## 2023-08-02 DIAGNOSIS — I639 Cerebral infarction, unspecified: Secondary | ICD-10-CM

## 2023-08-17 ENCOUNTER — Other Ambulatory Visit (HOSPITAL_COMMUNITY): Payer: Self-pay | Admitting: *Deleted

## 2023-08-17 DIAGNOSIS — R1310 Dysphagia, unspecified: Secondary | ICD-10-CM

## 2023-08-21 ENCOUNTER — Ambulatory Visit: Payer: No Typology Code available for payment source | Attending: Internal Medicine

## 2023-08-21 DIAGNOSIS — R41841 Cognitive communication deficit: Secondary | ICD-10-CM | POA: Insufficient documentation

## 2023-08-21 DIAGNOSIS — R1310 Dysphagia, unspecified: Secondary | ICD-10-CM | POA: Diagnosis not present

## 2023-08-21 DIAGNOSIS — R1312 Dysphagia, oropharyngeal phase: Secondary | ICD-10-CM | POA: Diagnosis not present

## 2023-08-21 NOTE — Therapy (Signed)
OUTPATIENT SPEECH LANGUAGE PATHOLOGY SWALLOW EVALUATION   Patient Name: HARVEER EDGERSON MRN: [id number] DOB:[birth year], 77 y.o., male Today's Date: 08/21/2023  PCP: [facility] REFERRING PROVIDER: [facility] [place], Dorthula Matas)  END OF SESSION:  End of Session - 08/21/23 1132     Visit Number 1    Number of Visits 7    Date for SLP Re-Evaluation 10/02/23    Authorization Type [employer]/ [place] - auth for 15 visits    SLP Start Time 1143    SLP Stop Time  1230    SLP Time Calculation (min) 47 min    Activity Tolerance Patient tolerated treatment well             Past Medical History:  Diagnosis Date   Arthritis    Past Surgical History:  Procedure Laterality Date   TOTAL HIP ARTHROPLASTY Left 09/30/2019   Procedure: LEFT TOTAL HIP ARTHROPLASTY ANTERIOR APPROACH;  Surgeon: Tarry Kos, MD;  Location: [facility] OR;  Service: Orthopedics;  Laterality: Left;   TOTAL HIP ARTHROPLASTY Right 12/02/2019   Procedure: RIGHT TOTAL HIP ARTHROPLASTY ANTERIOR APPROACH;  Surgeon: Tarry Kos, MD;  Location: [facility] OR;  Service: Orthopedics;  Laterality: Right;   Patient Active Problem List   Diagnosis Date Noted   CVA (cerebral vascular accident) (HCC) 06/29/2023   Status post total replacement of right hip 12/02/2019   Status post total replacement of left hip 09/30/2019   Primary osteoarthritis of right hip 08/23/2019    ONSET DATE: 06/29/23;    REFERRING DIAG: I69.391 (ICD-10-CM) - Dysphagia following cerebral infarction  THERAPY DIAG:  Dysphagia, unspecified type  Cognitive communication deficit  Rationale for Evaluation and Treatment: Rehabilitation  SUBJECTIVE:   SUBJECTIVE STATEMENT: "I get strangled a lot" Pt accompanied by: self  PERTINENT HISTORY: "KASPAR USELTON is a 77 y.o. who  has a past medical history of Arthritis.  Patient presented on 06/29/2023 with leg weakness, falls, dizziness and leaning to the right for 2 days. MRI showed left  pontine infarct. CTA showed moderate to severe right P2 proximal and distal stenosis. Bilateral thyroid nodules noted on CTA. He was started on asa and Plavix for three weeks then asa alone. PT/OT recommended outpatient therapy. Rolling walker ordered in hospital. He was discharged home 06/30/2023. Personally reviewed hospitalization pertinent progress notes, lab work and imaging.  Evaluated by Dr Roda Shutters.    Since discharge, he reports doing well. Outpatient PT/OT evaluations performed but he was told to continue HEP. No formal therapy recommended. He is very active. He walks daily. He did have an isolated event a few days ago where he had sharp pains in left leg and felt it was weaker. Symptoms spontaneously resolved within a couple hours. He denies changes in gait. No falls. He does have some fatigue. He endorses more stress from helping friends who are ill. He sleeps well. Usually 7-8 hours a night. No obvious concerns of sleep apnea.    He continues asa and atorvastatin. PCP provider reduced dose to 40mg . Unclear why. Patient reports PCP was worried about possible side effects. BP is usually 120/70s. 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 He tells me about an episode of rectal bleeding a few weeks ago for one day. Has not noted any further bleeding. He is scheduled to have GI consult. Hx of hemorrhoids. He lives with his wife and son. Drives without difficulty. Followed by [facility]. "  PAIN:  Are you having pain? Yes: NPRS scale: 2/10  Pain location: neck Pain description: acute  FALLS: Has patient fallen in last 6 months?  No  LIVING ENVIRONMENT: Lives with: lives with their family Lives in: House/apartment  PLOF:  Level of assistance: Independent with ADLs, Independent with IADLs Employment: Full-time employment Designer, television/film set)   PATIENT GOALS: Improve swallow safety   OBJECTIVE:  Note: Objective measures were completed at Evaluation unless otherwise noted. OBJECTIVE:    DIAGNOSTIC FINDINGS: Brain: The acute infarct in the left hemi pons noted on the same-day. MRI is not particularly distinct on CT. No evidence of additional acute infarct, hemorrhage, mass, mass effect, or midline shift. No hydrocephalus or extra-axial fluid collection. Mildly prominent extra-axial CSF spaces.  INSTRUMENTAL SWALLOW STUDY FINDINGS None Objective swallow impairments: N/A Objective recommended compensations: N/A  COGNITION: Overall cognitive status: Impaired Areas of impairment: Memory Comments: Pt endorsed significant concerns with short term memory, including forgetting to complete simple tasks (ex: putting lid back on container or put instant coffee in mug), recalling names of familiar people, and remembering immediate family member phone numbers. Despite concerns reported, declined ST intervention targeting cognition at this time d/t focusing on other health issues   SUBJECTIVE DYSPHAGIA REPORTS:  Date of onset: since CVA Reported symptoms: coughing with liquids, globus sensation, and heartburn  Current diet: regular and thin liquids  Co-morbid voice changes: No  FACTORS WHICH MAY INCREASE RISK OF ADVERSE EVENT IN PRESENCE OF ASPIRATION:  General health: well appearing  Risk factors: possible GERD or other GI disease    ORAL MOTOR EXAMINATION: Overall status: WFL Comments: strong volitional cough   CLINICAL SWALLOW ASSESSMENT:   Dentition: missing dentition  Vocal quality at baseline: normal Patient directly observed with POs: Yes: regular and thin liquids  Feeding: able to feed self Liquids provided by: cup [facility] Swallow Protocol: Pass Oral phase signs and symptoms:  WFL Pharyngeal phase signs and symptoms: audible swallow, delayed cough, and complaints of globus (s/sx only present when pt attempting to talk when eating/drinking)   PATIENT REPORTED OUTCOME MEASURES (PROM): EAT-10: 2 (swallowing pills takes extra effort- sometimes)   Pill- 5: 1 (pills stick  in my throat - almost never)  RSI: 10 (> 13 suggestive of LPR) Memory Mistakes Questionnaire: 57 ("often" only related to telephone numbers)   MOCA: 18/22 (Normal =/> 18)  TODAY'S TREATMENT:                                                                                                                                         08-21-23: Based on clinical observations, provided recommendation to limit talking while eating/drinking to optimize safety during swallow. Once pt ceased talking, demonstrated improved tolerance. MBSS already scheduled to further evaluate pharyngeal function and aspiration risk d/t recent stroke. Pending MBSS results, plan to initiate education and training of additional swallowing strategies and/or exercises as needed. Despite reporting recent concern for cognitive deficits, pt declined desire to further address  cognition in ST at this time.   PATIENT EDUCATION: Education details: POC Person educated: Patient Education method: Explanation Education comprehension: verbalized understanding and needs further education   ASSESSMENT:  CLINICAL IMPRESSION: Patient is a 77 y.o. M who was seen today for dysphagia s/p stroke in October 2024. Endorsed intermittent difficulty swallowing both solids and liquids resulting in strangling sensation. Oral mech exam was unremarkable. [employer] with no overt s/sx of aspiration. Presented with some immediate and delayed coughing during PO trials, which appears related to patient attempting to talk and analyze reported globus sensation. Recommended increased mindfulness while eating, including reduction in talking during PO intake. MBSS already scheduled 09-01-23. Scheduled f/u OP visit to discuss MBSS results and address any observed deficits. Endorsed significant memory concerns, including reduced recall of names, telephone numbers, and simple tasks. Despite reported concern, declined need for ST intervention addressing cognition at this  time.  OBJECTIVE IMPAIRMENTS: include memory and dysphagia. These impairments are limiting patient from household responsibilities and safety when swallowing. Factors affecting potential to achieve goals and functional outcome are  none . Patient will benefit from skilled SLP services to address above impairments and improve overall function.  REHAB POTENTIAL: Good   GOALS: Goals reviewed with patient? Yes  LONG TERM GOALS: Target date: 10/02/2023  (STG=LTGs)  Pt will carryover recommended swallow precautions > 1 week given rare min  Baseline: Goal status: INITIAL  2.  Pt will complete recommended swallow exercises (as needed) > 1 week given rare min A Baseline:  Goal status: INITIAL  3.  Pt will employ cognitive compensations to aid recall of pertinent information > 1 week given rare min A  Baseline:  Goal status: INITIAL  4.  Pt will subjectively report improvements via PROM by LTG date  Baseline: see above Goal status: INITIAL  PLAN:  SLP FREQUENCY: 1x/week  SLP DURATION: 6 weeks  PLANNED INTERVENTIONS: 92526 Treatment of swallowing function, Aspiration precaution training, Pharyngeal strengthening exercises, Diet toleration management , Environmental controls, Cueing hierachy, Internal/external aids, Functional tasks, SLP instruction and feedback, Compensatory strategies, and Patient/family education    Gracy Racer, CCC-SLP 08/21/2023, 1:22 PM

## 2023-09-01 ENCOUNTER — Ambulatory Visit (HOSPITAL_COMMUNITY)
Admission: RE | Admit: 2023-09-01 | Discharge: 2023-09-01 | Disposition: A | Discharge status: Home / Self Care | Payer: No Typology Code available for payment source | Source: Ambulatory Visit | Attending: *Deleted | Admitting: *Deleted

## 2023-09-01 ENCOUNTER — Ambulatory Visit (HOSPITAL_COMMUNITY)
Admission: RE | Admit: 2023-09-01 | Discharge: 2023-09-01 | Disposition: A | Discharge status: Home / Self Care | Payer: No Typology Code available for payment source | Source: Ambulatory Visit | Attending: Internal Medicine | Admitting: Internal Medicine

## 2023-09-01 DIAGNOSIS — R1310 Dysphagia, unspecified: Secondary | ICD-10-CM | POA: Insufficient documentation

## 2023-09-01 NOTE — Progress Notes (Signed)
Modified Barium Swallow Study  Patient Details  Name: Gerald Whitaker MRN: [id number] Date of Birth: 02-24-1946  Today's Date: 09/01/2023  Modified Barium Swallow completed.  Full report located under Chart Review in the Imaging Section.  History of Present Illness Patient is a 77 year old male referred for outpatient modified barium swallow study.  He has past medical history significant for left pontine CVA on 06/29/2023 resulting in weakness, and leaning to the right for 2 days.  Patient was provided with home exercise program for PT and OT and no follow-up therapy was recommended.  Per referral notes he walks daily and is very active and denies changes in his gait.  Medication list includes aspirin and atorvastatin.  He also has history of thyroid nodules and reports difficulty swallowing pills.  Memory difficulties reported by patient including simple rote tasks.  Dysphagia symptoms are notable for coughing with liquids, globus sensation and heartburn and diet was regular and thin prior to this evaluation.  Clinical swallow evaluation completed with patient having some missing dentition and demonstrating audible swallow, delayed cough, and complaints of globus.  Per referring SLP signs and symptoms were only present when patient was attempting to talk when eating and drinking.  Patient did pass [facility] stroke swallow screen during admission for stroke and SLP visited reported grossly functional swallow.  Outpatient SLP recommended MBS when she saw patient in clinic.  Per ultrasound completed  September 2023,  pt with  right inferior thyroid nodule.   Clinical Impression Pt demonstrates mild oropharyngeal dysphagia; strength WNL, hyoid excusion and vestibular closure initiated when liquids reach pyriform sinuses. There are instances of deep flash penetration with thin liquids and high flash penetration with nectar thick liquids (PAS 2). Vestibular closure is strong and ejects penetrate. Pt did not cough  during assessment. Small sips and an oral hold help pt eliminate or decrease penetration. Trialed a supraglottic swallow as well which was also effective. Pt additionally has a prominent CP with trace residue above bar. Pill did not lodge here during exam and passed esophagus easily. Pt knew pill passed but reports that sometimes it sticks and he drinks some water and it passes, which is a sufficient strategy. Puree may also help. Defer to referring therapist regarding plan. No need for diet modification today. Factors that may increase risk of adverse event in presence of aspiration Gerald Whitaker & Gerald Whitaker 2021):    Swallow Evaluation Recommendations Recommendations: PO diet PO Diet Recommendation: Regular;Thin liquids (Level 0) Liquid Administration via: Cup;Straw Medication Administration: Whole meds with liquid Supervision: Patient able to self-feed Swallowing strategies  : Hold breath before and during swallow (supraglottic swallow) Oral care recommendations: Pt independent with oral care      Gerald Whitaker, Gerald Whitaker 09/01/2023,1:59 PM

## 2023-09-11 ENCOUNTER — Ambulatory Visit: Payer: No Typology Code available for payment source

## 2023-09-11 DIAGNOSIS — R1310 Dysphagia, unspecified: Secondary | ICD-10-CM | POA: Diagnosis not present

## 2023-09-11 DIAGNOSIS — R1312 Dysphagia, oropharyngeal phase: Secondary | ICD-10-CM | POA: Diagnosis not present

## 2023-09-11 DIAGNOSIS — R41841 Cognitive communication deficit: Secondary | ICD-10-CM | POA: Diagnosis not present

## 2023-09-11 NOTE — Therapy (Signed)
OUTPATIENT SPEECH LANGUAGE PATHOLOGY SWALLOW TREATMENT (DISCHARGE)   Patient Name: Gerald Whitaker MRN: [id number] DOB:1946-05-31, 77 y.o., male Today's Date: 09/11/2023  PCP: [facility] REFERRING PROVIDER: [facility], Dorthula Matas)  END OF SESSION:  End of Session - 09/11/23 1231     Visit Number 2    Number of Visits 7    Date for SLP Re-Evaluation 10/02/23    Authorization Type [employer]/ [place] - auth for 15 visits    SLP Start Time 1230    SLP Stop Time  1301    SLP Time Calculation (min) 31 min    Activity Tolerance Patient tolerated treatment well              Past Medical History:  Diagnosis Date   Arthritis    Past Surgical History:  Procedure Laterality Date   TOTAL HIP ARTHROPLASTY Left 09/30/2019   Procedure: LEFT TOTAL HIP ARTHROPLASTY ANTERIOR APPROACH;  Surgeon: Tarry Kos, MD;  [facility]: [facility];  Service: Orthopedics;  Laterality: Left;   TOTAL HIP ARTHROPLASTY Right 12/02/2019   Procedure: RIGHT TOTAL HIP ARTHROPLASTY ANTERIOR APPROACH;  Surgeon: Tarry Kos, MD;  Location: [facility] OR;  Service: Orthopedics;  Laterality: Right;   Patient Active Problem List   Diagnosis Date Noted   CVA (cerebral vascular accident) (HCC) 06/29/2023   Status post total replacement of right hip 12/02/2019   Status post total replacement of left hip 09/30/2019   Primary osteoarthritis of right hip 08/23/2019    ONSET DATE: 06/29/23  REFERRING DIAG: O75.643 (ICD-10-CM) - Dysphagia following cerebral infarction  THERAPY DIAG:  Dysphagia, oropharyngeal phase  Rationale for Evaluation and Treatment: Rehabilitation  SUBJECTIVE:   SUBJECTIVE STATEMENT: "Swallowing is going well"  Pt accompanied by: self  PERTINENT HISTORY: "Gerald Whitaker is a 77 y.o. who  has a past medical history of Arthritis.  Patient presented on 06/29/2023 with leg weakness, falls, dizziness and leaning to the right for 2 days. MRI showed left pontine infarct.  CTA showed moderate to severe right P2 proximal and distal stenosis. Bilateral thyroid nodules noted on CTA. He was started on asa and Plavix for three weeks then asa alone. PT/OT recommended outpatient therapy. Rolling walker ordered in hospital. He was discharged home 06/30/2023. Personally reviewed hospitalization pertinent progress notes, lab work and imaging.  Evaluated by Dr Roda Shutters.    Since discharge, he reports doing well. Outpatient PT/OT evaluations performed but he was told to continue HEP. No formal therapy recommended. He is very active. He walks daily. He did have an isolated event a few days ago where he had sharp pains in left leg and felt it was weaker. Symptoms spontaneously resolved within a couple hours. He denies changes in gait. No falls. He does have some fatigue. He endorses more stress from helping friends who are ill. He sleeps well. Usually 7-8 hours a night. No obvious concerns of sleep apnea.    He continues asa and atorvastatin. PCP provider reduced dose to 40mg . Unclear why. Patient reports PCP was worried about possible side effects. BP is usually 120/70s. 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 He tells me about an episode of rectal bleeding a few weeks ago for one day. Has not noted any further bleeding. He is scheduled to have GI consult. Hx of hemorrhoids. He lives with his wife and son. Drives without difficulty. [facility] by [facility]. "  PAIN:  Are you having pain? Yes: NPRS scale: 2/10 Pain location: neck Pain  description: acute  FALLS: Has patient fallen in last 6 months?  No  LIVING ENVIRONMENT: Lives with: lives with their family Lives in: House/apartment  PLOF:  Level of assistance: Independent with ADLs, Independent with IADLs Employment: Full-time employment Designer, television/film set)   PATIENT GOALS: Improve swallow safety   OBJECTIVE:  Note: Objective measures were completed at Evaluation unless otherwise noted. OBJECTIVE:   DIAGNOSTIC FINDINGS:  Brain: The acute infarct in the left hemi pons noted on the same-day. MRI is not particularly distinct on CT. No evidence of additional acute infarct, hemorrhage, mass, mass effect, or midline shift. No hydrocephalus or extra-axial fluid collection. Mildly prominent extra-axial CSF spaces.  INSTRUMENTAL SWALLOW STUDY FINDINGS MBSS (09/01/23) Clinical Impression Pt demonstrates mild oropharyngeal dysphagia; strength WNL, hyoid excusion and vestibular closure initiated when liquids reach pyriform sinuses. There are instances of deep flash penetration with thin liquids and high flash penetration with nectar thick liquids (PAS 2). Vestibular closure is strong and ejects penetrate. Pt did not cough during assessment. Small sips and an oral hold help pt eliminate or decrease penetration. Trialed a supraglottic swallow as well which was also effective. Pt additionally has a prominent CP with trace residue above bar. Pill did not lodge here during exam and passed esophagus easily. Pt knew pill passed but reports that sometimes it sticks and he drinks some water and it passes, which is a sufficient strategy. Puree may also help. Defer to referring therapist regarding plan. No need for diet modification today.   Swallow Evaluation Recommendations Recommendations: PO diet PO Diet Recommendation: Regular;Thin liquids (Level 0) Liquid Administration via: Cup;Straw Medication Administration: Whole meds with liquid Supervision: Patient able to self-feed Swallowing strategies  : Hold breath before and during swallow (supraglottic swallow) Oral care recommendations: Pt independent with oral care   TODAY'S TREATMENT:                                                                                                                                         09-11-23: Reviewed recent MBSS results, including recommended compensations for small sips and supraglottic swallows for bolus control. Pt able to demo targeted  techniques with mod I. No overt s/sx of aspiration exhibited today. Reported rare coughing episodes but indicated improved swallow function with use of recommended compensations, being more mindful and intentional while eating, and reducing talking during PO intake. Pt denied difficulty with pills and solids. Declined need for additional ST services at this time targeting swallowing or cognition. Discharge completed. Pt verbalized agreement.   08-21-23: Based on clinical observations, provided recommendation to limit talking while eating/drinking to optimize safety during swallow. Once pt ceased talking, demonstrated improved tolerance. MBSS already scheduled to further evaluate pharyngeal function and aspiration risk d/t recent stroke. Pending MBSS results, plan to initiate education and training of additional swallowing strategies and/or exercises as needed. Despite reporting recent concern for cognitive deficits, pt declined desire to  further address cognition in ST at this time.   PATIENT EDUCATION: Education details: POC Person educated: Patient Education method: Explanation Education comprehension: verbalized understanding and needs further education   ASSESSMENT:  CLINICAL IMPRESSION: Patient is a 77 y.o. M who was seen today for dysphagia s/p stroke in October 2024. See MBSS results above. Pt able to carryover recommended compensations and strategies with mod I. No overt s/sx of aspiration exhibited today. Denied further swallowing or cognitive concerns. Discharge completed. Pt verbalized understanding and agreement.   OBJECTIVE IMPAIRMENTS: include memory and dysphagia. These impairments are limiting patient from household responsibilities and safety when swallowing. Factors affecting potential to achieve goals and functional outcome are  none . Patient will benefit from skilled SLP services to address above impairments and improve overall function.  REHAB POTENTIAL:  Good   GOALS: Goals reviewed with patient? Yes  LONG TERM GOALS: Target date: 10/02/2023  (STG=LTGs)  Pt will carryover recommended swallow precautions > 1 week given rare min  Baseline: Goal status: MET  2.  Pt will complete recommended swallow exercises (as needed) > 1 week given rare min A Baseline:  Goal status: DEFERRED  3.  Pt will employ cognitive compensations to aid recall of pertinent information > 1 week given rare min A  Baseline:  Goal status: DEFERRED   4.  Pt will subjectively report improvements via PROM by LTG date  Baseline:  Goal status: MET  PLAN:  SLP FREQUENCY: 1x/week  SLP DURATION: 6 weeks  PLANNED INTERVENTIONS: 92526 Treatment of swallowing function, Aspiration precaution training, Pharyngeal strengthening exercises, Diet toleration management , Environmental controls, Cueing hierachy, Internal/external aids, Functional tasks, SLP instruction and feedback, Compensatory strategies, and Patient/family education    Gracy Racer, CCC-SLP 09/11/2023, 12:51 PM

## 2023-10-12 ENCOUNTER — Ambulatory Visit: Payer: No Typology Code available for payment source | Admitting: Endocrinology

## 2023-10-12 ENCOUNTER — Encounter: Payer: Self-pay | Admitting: Endocrinology

## 2023-10-12 VITALS — BP 142/70 | HR 94 | Resp 20 | Ht 72.0 in | Wt 214.4 lb

## 2023-10-12 DIAGNOSIS — E042 Nontoxic multinodular goiter: Secondary | ICD-10-CM | POA: Diagnosis not present

## 2023-10-12 DIAGNOSIS — E041 Nontoxic single thyroid nodule: Secondary | ICD-10-CM | POA: Diagnosis not present

## 2023-10-12 NOTE — Patient Instructions (Signed)
Plan for thyroid ultrasound for thyroid nodules.

## 2023-10-12 NOTE — Addendum Note (Signed)
Addended by: Ysabella Babiarz, [place] on: 10/12/2023 04:41 PM   Modules accepted: Level of Service

## 2023-10-12 NOTE — Progress Notes (Signed)
Outpatient Endocrinology Note Gerald Idaliz Tinkle, MD   Patient's Name: Gerald Whitaker    DOB: 03/26/1946    MRN: [id number]  REASON OF VISIT: New consult for thyroid nodules   PCP: [facility]  REFERRING PROVIDER: Agustina Caroli, MD  HISTORY OF PRESENT ILLNESS:   Gerald Whitaker is a 78 y.o. old male with past medical history as listed below is presented for new consult for thyroid nodules.  Pertinent Thyroid History:  Patient is referred from [facility] clinic for evaluation and management of thyroid nodules.  Patient had stroke in October 2024 and had CT angio head and neck showed multiple nodules in the bilateral thyroid lobes, the largest of which measures up to 1.9 cm.  With a chart review patient had fine-needle aspiration biopsy of right thyroid nodule in May 17, 2022 for right inferior thyroid nodule measuring maximum dimension 2.8 cm, cytology nondiagnostic.  No prior ultrasound images available to review.  FINAL MICROSCOPIC DIAGNOSIS: In May 17, 2022. - Scant follicular epithelium present (Bethesda category I)  - Nondiagnostic material   SPECIMEN ADEQUACY:  Satisfactory but limited for evaluation, scant cellularity   -Patient has dysphagia after the stroke he had in October 2024.  No hypo and hyperthyroid symptoms.  He is euthyroid and not on thyroid medication.  He had normal thyroid function test in October 2024, TSH 1.565.  -No family history of thyroid disorder or thyroid cancer.  No radiation exposure to head and neck.  REVIEW OF SYSTEMS:  As per history of present illness.   PAST MEDICAL HISTORY: Past Medical History:  Diagnosis Date   Arthritis     PAST SURGICAL HISTORY: Past Surgical History:  Procedure Laterality Date   TOTAL HIP ARTHROPLASTY Left 09/30/2019   Procedure: LEFT TOTAL HIP ARTHROPLASTY ANTERIOR APPROACH;  Surgeon: Tarry Kos, MD;  Location: [facility] OR;  Service: Orthopedics;  Laterality: Left;   TOTAL HIP ARTHROPLASTY Right 12/02/2019    Procedure: RIGHT TOTAL HIP ARTHROPLASTY ANTERIOR APPROACH;  Surgeon: Tarry Kos, MD;  Location: [facility];  Service: Orthopedics;  Laterality: Right;    ALLERGIES: Allergies  Allergen Reactions   Other Other (See Comments)    NO BLOOD PRODUCTS   Aspirin Nausea And Vomiting    GI Intolerance    FAMILY HISTORY:  History reviewed. No pertinent family history.  SOCIAL HISTORY: Social History   Socioeconomic History   Marital status: Married    Spouse name: Not on file   Number of children: Not on file   Years of education: Not on file   Highest education level: Not on file  Occupational History   Not on file  Tobacco Use   Smoking status: Former    Current packs/day: 0.00    Types: Cigarettes    Quit date: 03/16/1983    Years since quitting: 40.6   Smokeless tobacco: Never  Vaping Use   Vaping status: Never Used  Substance and Sexual Activity   Alcohol use: Not Currently    Comment: rarely   Drug use: Never   Sexual activity: Not on file  Other Topics Concern   Not on file  Social History Narrative   Not on file   Social Drivers of Health   Financial Resource Strain: Not on file  Food Insecurity: No Food Insecurity (06/29/2023)   Hunger Vital Sign    Worried About Running Out of Food in the Last Year: Never true    Ran Out of Food in the Last Year: Never  true  Transportation Needs: No Transportation Needs (06/29/2023)   PRAPARE - Administrator, Civil Service (Medical): No    Lack of Transportation (Non-Medical): No  Physical Activity: Not on file  Stress: Not on file  Social Connections: Unknown (01/24/2022)   [facility]   Social Network    Social Network: Not on file    MEDICATIONS:  Current Outpatient Medications  Medication Sig Dispense Refill   aspirin EC 81 MG tablet Take 1 tablet (81 mg total) by mouth daily. Swallow whole. 90 tablet 0   clopidogrel (PLAVIX) 75 MG tablet Take 1 tablet (75 mg total) by mouth  daily. 19 tablet 0   Multiple Vitamin (MULTIVITAMIN WITH MINERALS) TABS tablet Take 2 tablets by mouth daily.     Nutritional Supplements (GRAPESEED EXTRACT PO) Take 1 capsule by mouth in the morning, at noon, and at bedtime. Muscadine seed/skin capsule     OVER THE COUNTER MEDICATION Take 1 Scoop by mouth See admin instructions. Super Greens supplement powder, patient uses weekly     OVER THE COUNTER MEDICATION Take 3 capsules by mouth daily. Enhanced Oral Chelation II     OVER THE COUNTER MEDICATION Take 4 sprays by mouth daily. Gluta-Myst     OVER THE COUNTER MEDICATION Take 4 sprays by mouth daily. Xantho-Myst Plus     OVER THE COUNTER MEDICATION Take 1 Scoop by mouth 2 (two) times daily. Gaditana Original Formula     OVER THE COUNTER MEDICATION Take 1 Scoop by mouth daily as needed (immune support). Colloidal Silver Supplemental Powder. Mix in shake and drink once daily and as needed for immune support     atorvastatin (LIPITOR) 80 MG tablet Take 1 tablet (80 mg total) by mouth daily. (Patient not taking: Reported on 10/12/2023) 30 tablet 1   No current facility-administered medications for this visit.    PHYSICAL EXAM: Vitals:   10/12/23 1526  BP: (!) 142/70  Pulse: 94  Resp: 20  SpO2: 97%  Weight: 214 lb 6.4 oz (97.3 kg)  Height: 6' (1.829 m)   Body mass index is 29.08 kg/m.  Wt Readings from Last 3 Encounters:  10/12/23 214 lb 6.4 oz (97.3 kg)  08/02/23 209 lb 8 oz (95 kg)  06/29/23 202 lb 9.6 oz (91.9 kg)     General: Well developed, well nourished male in no apparent distress.  HEENT: AT/[place], no external lesions. Hearing intact to the spoken word Eyes: EOMI. No stare, proptosis. Conjunctiva clear and no icterus. Neck: Trachea midline, neck supple without appreciable thyromegaly or lymphadenopathy and no palpable thyroid nodules Lungs: Clear to auscultation, no wheeze. Respirations not labored Heart: S1S2, Regular in rate and rhythm. No loud murmurs Abdomen: Soft, non  tender, non distended Neurologic: Alert, oriented, normal speech, deep tendon biceps reflexes normal,  no gross focal neurological deficit Extremities: No pedal pitting edema, no tremors of outstretched hands Skin: Warm, color good.  Psychiatric: Does not appear depressed or anxious  PERTINENT HISTORIC LABORATORY AND IMAGING STUDIES:  All pertinent laboratory results were reviewed. Please see HPI also for further details.   TSH  Date Value Ref Range Status  06/30/2023 1.565 0.350 - 4.500 uIU/mL Final    Comment:    Performed by a 3rd Generation assay with a functional sensitivity of <=0.01 uIU/mL. Performed at [facility], [address] [place], [place] [address]      ASSESSMENT / PLAN  1. Right thyroid nodule   2. Multiple  thyroid nodules    -Patient is known to have thyroid nodule, had needle biopsy of right thyroid nodule in September 2023 with nondiagnostic cytology.  Patient again noted to have multiple thyroid nodules on CTA head and neck during evaluation of the stroke in October 2024. -Patient is euthyroid, not on thyroid medication. -No prior ultrasound thyroid images available to review.  Plan: -Check ultrasound thyroid for evaluation of thyroid nodules. -Previous needle biopsy cytology was nondiagnostic, based on criteria will plan for repeat FNA of thyroid nodule.  Thyroid nodule / FNA talk: -Approximately 5% of individuals have palpable thyroid nodules and 30% or more of adults have non-palpable nodules. The prevalence of nodules increases with age, so that perhaps 50% of individuals older than 79 years of age have nodules. Many of these nodules will be well under 1cm in size.  -The incidence of thyroid cancer is low, but rising. The prevalence of thyroid cancer is low compared with the prevalence of thyroid nodule.  -There have been a number of studies that have estimated risk of malignancy in thyroid nodule. The estimated risk varies with size and other  characteristics of nodules selected for biopsy, the technique used for the biopsy, the institution and its referral patterns, and the characteristics of the local population. Most studies have estimated malignancy risk in nodules that are palpable or > 1cm on U/S to be in the range of 3-15%.    - In general, options for further evaluation include: - Follow with serial U/S - Fine needle aspiration biopsy - Thyroidectomy - In general the criteria for FNA biopsy includes:  - All palpable nodules - Generally nodules > 1 and for some nodules up to > 1 to 2 cm, based on other criteria.  - Rarely Nodules > 8-67mm in two or more dimensions with high risk sonographic features, or occuring in patients with high risk historical features. Nodules not meeting the above criteria can generally be followed with serial ultrasounds.  -Thyroid FNA is required for further evaluation of nodule to see if suspicious for cancer which may require further surgical treatment. -There are false positive and false negative results with FNA and need for repeat FNA or surgical resection in some cases and also possibility of missing a diagnosis of Cancer in 5-15% of cases. -discussed the risk and benefits of procedure and potential complications such as bleeding, pain in neck, swelling in neck in rare cases due to hematoma formation and causing respiratory compromise, possibility of infection etc. -discussed the need for continued follow up even if the nodule were found to be benign on FNA.  -discussed that the only near 100% method of ruling out thyroid cancer is by surgical resection. -patient agreeable for Thyroid FNA, if needed.  Diagnoses and all orders for this visit:  Right thyroid nodule -     US THYROID; Future  Multiple thyroid nodules    DISPOSITION Follow up in clinic in 6 months suggested.  All questions answered and patient verbalized understanding of the plan.  Gerald Collier Monica, MD [facility]  Endocrinology [facility] [address] [place], [address] [place], [place] [address] Phone # [phone]  At least part of this note was generated using voice recognition software. Inadvertent word errors may have occurred, which were not recognized during the proofreading process.

## 2023-10-18 ENCOUNTER — Encounter: Payer: Self-pay | Admitting: Endocrinology

## 2023-10-18 ENCOUNTER — Ambulatory Visit
Admission: RE | Admit: 2023-10-18 | Discharge: 2023-10-18 | Disposition: A | Discharge status: Home / Self Care | Payer: No Typology Code available for payment source | Source: Ambulatory Visit | Attending: Endocrinology | Admitting: Endocrinology

## 2023-10-18 ENCOUNTER — Other Ambulatory Visit: Payer: Self-pay | Admitting: Endocrinology

## 2023-10-18 ENCOUNTER — Telehealth: Payer: Self-pay

## 2023-10-18 DIAGNOSIS — E041 Nontoxic single thyroid nodule: Secondary | ICD-10-CM

## 2023-10-18 NOTE — Telephone Encounter (Signed)
-----   Message from Gerald Whitaker sent at 10/18/2023  3:50 PM EST ----- Please notify patient of ultrasound thyroid  reviewed patient has multiple thyroid  nodules, dominant right thyroid  nodule measuring 2.2 cm solid, this nodule biopsy was nondiagnostic in the past.  I have referred to IR for repeat needle biopsy of this right solid nodule.  Other nodules can be monitored with serial ultrasound.

## 2023-10-18 NOTE — Telephone Encounter (Signed)
 Patient given results  as directed by MD. No further questions at this time.

## 2023-11-01 ENCOUNTER — Inpatient Hospital Stay: Admission: RE | Admit: 2023-11-01 | Payer: No Typology Code available for payment source | Source: Ambulatory Visit

## 2023-11-03 ENCOUNTER — Ambulatory Visit
Admission: RE | Admit: 2023-11-03 | Discharge: 2023-11-03 | Disposition: A | Discharge status: Home / Self Care | Payer: No Typology Code available for payment source | Source: Ambulatory Visit | Attending: Endocrinology | Admitting: Endocrinology

## 2023-11-03 ENCOUNTER — Other Ambulatory Visit (HOSPITAL_COMMUNITY)
Admission: RE | Admit: 2023-11-03 | Discharge: 2023-11-03 | Disposition: A | Discharge status: Home / Self Care | Payer: No Typology Code available for payment source | Source: Ambulatory Visit | Attending: Interventional Radiology | Admitting: Interventional Radiology

## 2023-11-03 DIAGNOSIS — E041 Nontoxic single thyroid nodule: Secondary | ICD-10-CM | POA: Diagnosis present

## 2023-11-06 ENCOUNTER — Encounter: Payer: Self-pay | Admitting: Endocrinology

## 2023-11-07 ENCOUNTER — Encounter: Payer: Self-pay | Admitting: Endocrinology

## 2023-11-07 LAB — CYTOLOGY - NON PAP

## 2023-11-08 ENCOUNTER — Telehealth: Payer: Self-pay

## 2023-11-08 NOTE — Telephone Encounter (Signed)
-----   Message from [place] Thapa sent at 11/07/2023  4:31 PM EST ----- Please notify patient of cytology result of needle biopsy of thyroid nodule reviewed and is benign benign follicular nodule.  Will continue to monitor this nodule with serial ultrasound.  Please make follow-up appointment with me in 1 year.

## 2023-11-08 NOTE — Telephone Encounter (Signed)
 Patient given results  as directed by MD. No further questions at this time.

## 2024-01-05 DIAGNOSIS — D696 Thrombocytopenia, unspecified: Secondary | ICD-10-CM | POA: Diagnosis not present

## 2024-01-05 DIAGNOSIS — R972 Elevated prostate specific antigen [PSA]: Secondary | ICD-10-CM | POA: Diagnosis not present

## 2024-01-05 DIAGNOSIS — Z1212 Encounter for screening for malignant neoplasm of rectum: Secondary | ICD-10-CM | POA: Diagnosis not present

## 2024-01-05 DIAGNOSIS — R7989 Other specified abnormal findings of blood chemistry: Secondary | ICD-10-CM | POA: Diagnosis not present

## 2024-01-05 DIAGNOSIS — N401 Enlarged prostate with lower urinary tract symptoms: Secondary | ICD-10-CM | POA: Diagnosis not present

## 2024-01-12 DIAGNOSIS — G720 Drug-induced myopathy: Secondary | ICD-10-CM | POA: Diagnosis not present

## 2024-01-12 DIAGNOSIS — Z0000 Encounter for general adult medical examination without abnormal findings: Secondary | ICD-10-CM | POA: Diagnosis not present

## 2024-01-12 DIAGNOSIS — N401 Enlarged prostate with lower urinary tract symptoms: Secondary | ICD-10-CM | POA: Diagnosis not present

## 2024-01-12 DIAGNOSIS — R82998 Other abnormal findings in urine: Secondary | ICD-10-CM | POA: Diagnosis not present

## 2024-01-12 DIAGNOSIS — R972 Elevated prostate specific antigen [PSA]: Secondary | ICD-10-CM | POA: Diagnosis not present

## 2024-01-12 DIAGNOSIS — I679 Cerebrovascular disease, unspecified: Secondary | ICD-10-CM | POA: Diagnosis not present

## 2024-01-12 DIAGNOSIS — D696 Thrombocytopenia, unspecified: Secondary | ICD-10-CM | POA: Diagnosis not present

## 2024-01-12 DIAGNOSIS — Z1331 Encounter for screening for depression: Secondary | ICD-10-CM | POA: Diagnosis not present

## 2024-01-12 DIAGNOSIS — I6930 Unspecified sequelae of cerebral infarction: Secondary | ICD-10-CM | POA: Diagnosis not present

## 2024-01-12 DIAGNOSIS — K30 Functional dyspepsia: Secondary | ICD-10-CM | POA: Diagnosis not present

## 2024-01-12 DIAGNOSIS — Z96642 Presence of left artificial hip joint: Secondary | ICD-10-CM | POA: Diagnosis not present

## 2024-01-12 DIAGNOSIS — M545 Low back pain, unspecified: Secondary | ICD-10-CM | POA: Diagnosis not present

## 2024-01-12 DIAGNOSIS — Z1339 Encounter for screening examination for other mental health and behavioral disorders: Secondary | ICD-10-CM | POA: Diagnosis not present

## 2024-02-03 ENCOUNTER — Emergency Department (HOSPITAL_COMMUNITY)
Admission: EM | Admit: 2024-02-03 | Discharge: 2024-02-03 | Disposition: A | Discharge status: Home / Self Care | Attending: Emergency Medicine | Admitting: Emergency Medicine

## 2024-02-03 DIAGNOSIS — Z7902 Long term (current) use of antithrombotics/antiplatelets: Secondary | ICD-10-CM | POA: Insufficient documentation

## 2024-02-03 DIAGNOSIS — W57XXXA Bitten or stung by nonvenomous insect and other nonvenomous arthropods, initial encounter: Secondary | ICD-10-CM | POA: Insufficient documentation

## 2024-02-03 DIAGNOSIS — S70261A Insect bite (nonvenomous), right hip, initial encounter: Secondary | ICD-10-CM | POA: Diagnosis present

## 2024-02-03 DIAGNOSIS — Z7982 Long term (current) use of aspirin: Secondary | ICD-10-CM | POA: Diagnosis not present

## 2024-02-03 MED ORDER — DOXYCYCLINE HYCLATE 100 MG PO CAPS: 100.0000 mg | ORAL_CAPSULE | Freq: Two times a day (BID) | ORAL | 0 refills | Status: DC

## 2024-02-03 MED ORDER — DOXYCYCLINE HYCLATE 100 MG PO TABS
100.0000 mg | ORAL_TABLET | Freq: Once | ORAL | Status: AC
  Administered 2024-02-03: 100 mg via ORAL
  Filled 2024-02-03: qty 1

## 2024-02-03 NOTE — Discharge Instructions (Addendum)
 Please follow-up with your primary care provider in regards to recent symptoms and ER visit.  Today you are given your first dose of antibiotics but you will need pick with rest your pharmacy.  If symptoms change or worsen please return to the ER.

## 2024-02-03 NOTE — ED Provider Notes (Signed)
 [place] EMERGENCY DEPARTMENT AT [facility] Provider Note   CSN: [id number] Arrival date & time: 02/03/24  1343     History  No chief complaint on file.   Gerald Whitaker is a 78 y.o. male presented for tick bite on his right hip.  Patient states that he had itching on his right hip for the past week and when he looked last night he found a tick and removed it.  Patient denies any rashes, fevers, paralysis, chest pain.  Patient is he is here to have antibiotics and be discharged as he goes to the [place] and they are closed today.  Home Medications Prior to Admission medications   Medication Sig Start Date End Date Taking? Authorizing Provider  doxycycline (VIBRAMYCIN) 100 MG capsule Take 1 capsule (100 mg total) by mouth 2 (two) times daily. 02/03/24  Yes Denese Finn, PA-C  aspirin  EC 81 MG tablet Take 1 tablet (81 mg total) by mouth daily. Swallow whole. 07/01/23   Hongalgi, Anand D, MD  atorvastatin  (LIPITOR) 80 MG tablet Take 1 tablet (80 mg total) by mouth daily. Patient not taking: Reported on 10/12/2023 07/01/23   Hongalgi, Anand D, MD  clopidogrel  (PLAVIX ) 75 MG tablet Take 1 tablet (75 mg total) by mouth daily. 07/01/23   Hongalgi, Anand D, MD  Multiple Vitamin (MULTIVITAMIN WITH MINERALS) TABS tablet Take 2 tablets by mouth daily.    [provider]  Nutritional Supplements (GRAPESEED EXTRACT PO) Take 1 capsule by mouth in the morning, at noon, and at bedtime. [place] seed/skin capsule    [provider]  OVER THE COUNTER MEDICATION Take 1 Scoop by mouth See admin instructions. Super Greens supplement powder, patient uses weekly    [provider]  OVER THE COUNTER MEDICATION Take 3 capsules by mouth daily. Enhanced Oral Chelation II    [provider]  OVER THE COUNTER MEDICATION Take 4 sprays by mouth daily. Gluta-Myst    [provider]  OVER THE COUNTER MEDICATION Take 4 sprays by mouth daily. Xantho-Myst Plus     [provider]  OVER THE COUNTER MEDICATION Take 1 Scoop by mouth 2 (two) times daily. Gaditana Original Formula    [provider]  OVER THE COUNTER MEDICATION Take 1 Scoop by mouth daily as needed (immune support). Colloidal Silver Supplemental Powder. Mix in shake and drink once daily and as needed for immune support    [provider]      Allergies    Other and Aspirin     Review of Systems   Review of Systems  Physical Exam Updated Vital Signs BP (!) 146/63   Pulse 66   Temp 98.1 F (36.7 C)   Resp 20   SpO2 100%  Physical Exam Constitutional:      General: He is not in acute distress. Skin:    General: Skin is warm and dry.     Comments: Right hip: Bug bite noted without erythema migrans or other rashes, purulence, fluctuance, no signs of tick head  Neurological:     Mental Status: He is alert.     ED Results / Procedures / Treatments   Labs (all labs ordered are listed, but only abnormal results are displayed) Labs Reviewed - No data to display  EKG None  Radiology No results found.  Procedures Procedures    Medications Ordered in ED Medications  doxycycline (VIBRA-TABS) tablet 100 mg (has no administration in time range)    ED Course/ Medical  Decision Making/ A&P                                 Medical Decision Making Risk Prescription drug management.   Gerald Whitaker 78 y.o. presented today for tick bite.  Working DDx that I considered at this time includes, but not limited to, tick removal, Lyme disease, complete heart block, tickborne illness.  R/o DDx: Lyme disease, complete heart block, tickborne illness: These are considered less likely due to history of present illness and physical exam findings  Review of prior external notes: 01/13/2024 unknown  Unique Tests and My Independent Interpretation: None  Social Determinants of Health: none  Discussion with Independent Historian: None  Discussion of  Management of Tests: None  Risk: Medium: prescription drug management  Risk Stratification Score: None  Plan: On exam patient was no acute distress with stable vitals.  On exam patient's right hip does have a bug bite noted however no signs of erythema migrans and patient did not endorsing chest pain or paralysis or myalgias or arthralgias.  Patient states that he had itching there for a week and then last night noticed to take and removed it.  Patient did bring the tick with him and has not had a jarring showed me in while the tick does not appear engorged given the fact he had symptoms for a week we will prophylactically treat for Lyme disease.  I did offer to have the patient be seen by provider in the back however patient states he is only here to get antibiotics and to leave [facility].  Patient will be given doxycycline for Lyme prophylaxis along with a prescription.  Patient was encouraged to follow-up with primary care provider in the next 2 days to be reevaluated symptoms may change.  Patient was given return precautions. Patient stable for discharge at this time.  Patient verbalized understanding of plan.  This chart was dictated using voice recognition software.  Despite best efforts to proofread,  errors can occur which can change the documentation meaning.         Final Clinical Impression(s) / ED Diagnoses Final diagnoses:  Tick bite of right hip, initial encounter    Rx / DC Orders ED Discharge Orders          Ordered    doxycycline (VIBRAMYCIN) 100 MG capsule  2 times daily        02/03/24 1407              Denese Finn, PA-C 02/03/24 1411    Hershel Los, MD 02/04/24 1059

## 2024-02-03 NOTE — ED Triage Notes (Signed)
 Pt states that he has a tick bite on his Rt hip that was itching very bad & so he came in reporting decreased energy levels & when he scratched it the tick came out. A/Ox4.

## 2024-05-21 ENCOUNTER — Telehealth: Payer: Self-pay

## 2024-05-21 NOTE — Telephone Encounter (Signed)
 Patient requested call back to make him aware of what his upcoming appt was for. Call returned. No further questions at this time.

## 2024-05-23 ENCOUNTER — Encounter: Payer: Self-pay | Admitting: Endocrinology

## 2024-05-23 ENCOUNTER — Ambulatory Visit: Admitting: Endocrinology

## 2024-05-23 VITALS — BP 124/70 | HR 57 | Resp 20 | Ht 72.0 in | Wt 216.6 lb

## 2024-05-23 DIAGNOSIS — E042 Nontoxic multinodular goiter: Secondary | ICD-10-CM

## 2024-05-23 DIAGNOSIS — E041 Nontoxic single thyroid nodule: Secondary | ICD-10-CM

## 2024-05-23 LAB — TSH: TSH: 1.61 m[IU]/L (ref 0.40–4.50)

## 2024-05-23 LAB — T4, FREE: Free T4: 1.1 ng/dL (ref 0.8–1.8)

## 2024-05-23 NOTE — Progress Notes (Signed)
 Outpatient Endocrinology Note [place] Camil Hausmann, MD   Patient's Name: Gerald Whitaker    DOB: 1946-08-25    MRN: [id number]  REASON OF VISIT: Follow up for thyroid  nodules   PCP[facility]  REFERRING PROVIDER: Vinie Allean BIRCH, MD  HISTORY OF PRESENT ILLNESS:   Gerald Whitaker is a 78 y.o. old male with past medical history as listed below is presented for new consult for thyroid  nodules.  Pertinent Thyroid  History:  Patient was referred from [facility] clinic for evaluation and management of thyroid  nodules.  Initial consult in January 2025.    Background: Patient had stroke in October 2024 and had CT angio head and neck showed multiple nodules in the bilateral thyroid  lobes, the largest of which measures up to 1.9 cm.  With a chart review patient had fine-needle aspiration biopsy of right thyroid  nodule in May 17, 2022 for right inferior thyroid  nodule measuring maximum dimension 2.8 cm, cytology nondiagnostic.  No prior ultrasound images available to review.  FINAL MICROSCOPIC DIAGNOSIS: In May 17, 2022. - Scant follicular epithelium present (Bethesda category I)  - Nondiagnostic material   SPECIMEN ADEQUACY:  Satisfactory but limited for evaluation, scant cellularity   -Patient has dysphagia after the stroke he had in October 2024.  No hypo and hyperthyroid symptoms.  He is euthyroid and not on thyroid  medication.  He had normal thyroid  function test in October 2024, TSH 1.565.  -No family history of thyroid  disorder or thyroid  cancer.  No radiation exposure to head and neck.  # Ultrasound thyroid  in February 2025 : Multiple thyroid  nodules, relatively stable with dominant right thyroid  nodule measuring 2.2 cm.  IMPRESSION: 1. Similar findings of multinodular goiter. No worrisome new or enlarging thyroid  nodules. 2. Previously biopsied 2.2 cm right-sided thyroid  nodule (labeled 2) is unchanged compared to the 04/04/2022 examination. Correlation with previous biopsy  results is advised. Assuming a benign pathologic diagnosis, repeat sampling or continued dedicated follow-up is not recommended. 3. Additional discretely measured thyroid  nodules/cysts are unchanged compared to the 03/2022 examination and again do not meet imaging criteria to recommend percutaneous sampling or continued dedicated follow-up.   # FNA of right dominant thyroid  nodule in November 03, 2023 with benign cytology.  FINAL MICROSCOPIC DIAGNOSIS:  - Benign follicular nodule (Bethesda category II)   SPECIMEN ADEQUACY:  Satisfactory for evaluation   Interval history : Patient presented for follow-up.  He reports occasional dysphagia on swallowing with solid food otherwise no neck discomfort or neck compressive symptoms.  He is overall feeling usual state of health.  No hypo and hyperthyroid symptoms.  No other complaints today.  He had ultrasound thyroid  in February with FNA with benign cytology of right dominant thyroid  nodule.  REVIEW OF SYSTEMS:  As per history of present illness.   PAST MEDICAL HISTORY: Past Medical History:  Diagnosis Date   Arthritis     PAST SURGICAL HISTORY: Past Surgical History:  Procedure Laterality Date   TOTAL HIP ARTHROPLASTY Left 09/30/2019   Procedure: LEFT TOTAL HIP ARTHROPLASTY ANTERIOR APPROACH;  Surgeon: Jerri Kay HERO, MD;  Location: [facility] OR;  Service: Orthopedics;  Laterality: Left;   TOTAL HIP ARTHROPLASTY Right 12/02/2019   Procedure: RIGHT TOTAL HIP ARTHROPLASTY ANTERIOR APPROACH;  Surgeon: Jerri Kay HERO, MD;  Location: [facility] OR;  Service: Orthopedics;  Laterality: Right;    ALLERGIES: Allergies  Allergen Reactions   Other Other (See Comments)    NO BLOOD PRODUCTS   Aspirin  Nausea And Vomiting    GI Intolerance  FAMILY HISTORY:  History reviewed. No pertinent family history.  SOCIAL HISTORY: Social History   Socioeconomic History   Marital status: Married    Spouse name: Not on file   Number of children: Not on file    Years of education: Not on file   Highest education level: Not on file  Occupational History   Not on file  Tobacco Use   Smoking status: Former    Current packs/day: 0.00    Types: Cigarettes    Quit date: 03/16/1983    Years since quitting: 41.2   Smokeless tobacco: Never  Vaping Use   Vaping status: Never Used  Substance and Sexual Activity   Alcohol  use: Not Currently    Comment: rarely   Drug use: Never   Sexual activity: Not on file  Other Topics Concern   Not on file  Social History Narrative   Not on file   Social Drivers of Health   Financial Resource Strain: Not on file  Food Insecurity: No Food Insecurity (06/29/2023)   Hunger Vital Sign    Worried About Running Out of Food in the Last Year: Never true    Ran Out of Food in the Last Year: Never true  Transportation Needs: No Transportation Needs (06/29/2023)   PRAPARE - Administrator, Civil Service (Medical): No    Lack of Transportation (Non-Medical): No  Physical Activity: Not on file  Stress: Not on file  Social Connections: Unknown (01/24/2022)   [facility]   Social Network    Social Network: Not on file    MEDICATIONS:  No current outpatient medications on file.   No current facility-administered medications for this visit.    PHYSICAL EXAM: Vitals:   05/23/24 1306  BP: 124/70  Pulse: (!) 57  Resp: 20  SpO2: 99%  Weight: 216 lb 9.6 oz (98.2 kg)  Height: 6' (1.829 m)   Body mass index is 29.38 kg/m.  Wt Readings from Last 3 Encounters:  05/23/24 216 lb 9.6 oz (98.2 kg)  10/12/23 214 lb 6.4 oz (97.3 kg)  08/02/23 209 lb 8 oz (95 kg)     General: Well developed, well nourished male in no apparent distress.  HEENT: AT/[place], no external lesions. Hearing intact to the spoken word Eyes: EOMI. No stare, proptosis. Conjunctiva clear and no icterus. Neck: Trachea midline, neck supple without appreciable thyromegaly or lymphadenopathy and bl small palpable thyroid   nodules Lungs: Clear to auscultation, no wheeze. Respirations not labored Heart: S1S2, Regular in rate and rhythm. No loud murmurs Abdomen: Soft, non tender, non distended Neurologic: Alert, oriented, normal speech, deep tendon biceps reflexes normal,  no gross focal neurological deficit Extremities: No pedal pitting edema, no tremors of outstretched hands Skin: Warm, color good.  Psychiatric: Does not appear depressed or anxious  PERTINENT HISTORIC LABORATORY AND IMAGING STUDIES:  All pertinent laboratory results were reviewed. Please see HPI also for further details.   TSH  Date Value Ref Range Status  06/30/2023 1.565 0.350 - 4.500 uIU/mL Final    Comment:    Performed by a 3rd Generation assay with a functional sensitivity of <=0.01 uIU/mL. Performed at [facility], [address], [place], [place] [address]      ASSESSMENT / PLAN  1. Multiple thyroid  nodules   2. Right thyroid  nodule     -Patient is known to have thyroid  nodules, had needle biopsy of right thyroid  nodule in September 2023 with nondiagnostic cytology.  Patient again  noted to have multiple thyroid  nodules on CTA head and neck during evaluation of the stroke in October 2024.  Ultrasound thyroid  in February 2025 right dominant thyroid  nodule measuring 2.2 cm and left spongiform nodule measuring 1.9 cm. -Patient had FNA of right dominant thyroid  nodule with benign cytology in February 2025. -Patient is euthyroid, not on thyroid  medication.  Plan: - Will continue to monitor thyroid  nodules with serial ultrasound.  Will plan for repeat ultrasound in June 2026.  Order placed. - Check thyroid  function test today.  Diagnoses and all orders for this visit:  Multiple thyroid  nodules -     T4, free -     TSH -     US  THYROID ; Future  Right thyroid  nodule -     US  THYROID ; Future     DISPOSITION Follow up in clinic in 10-11 months suggested, after ultrasound thyroid .  All questions answered and  patient verbalized understanding of the plan.  [place] Rabecca Birge, MD [facility] [address], [address] [place], [place] [address] Phone # [phone]  At least part of this note was generated using voice recognition software. Inadvertent word errors may have occurred, which were not recognized during the proofreading process.

## 2024-05-24 ENCOUNTER — Ambulatory Visit: Payer: Self-pay | Admitting: Endocrinology

## 2024-06-11 DIAGNOSIS — Z833 Family history of diabetes mellitus: Secondary | ICD-10-CM | POA: Diagnosis not present

## 2024-06-11 DIAGNOSIS — Z8249 Family history of ischemic heart disease and other diseases of the circulatory system: Secondary | ICD-10-CM | POA: Diagnosis not present

## 2024-06-11 DIAGNOSIS — R2681 Unsteadiness on feet: Secondary | ICD-10-CM | POA: Diagnosis not present

## 2024-06-11 DIAGNOSIS — R32 Unspecified urinary incontinence: Secondary | ICD-10-CM | POA: Diagnosis not present

## 2024-06-11 DIAGNOSIS — Z87891 Personal history of nicotine dependence: Secondary | ICD-10-CM | POA: Diagnosis not present

## 2024-06-11 DIAGNOSIS — N400 Enlarged prostate without lower urinary tract symptoms: Secondary | ICD-10-CM | POA: Diagnosis not present

## 2024-06-11 DIAGNOSIS — I10 Essential (primary) hypertension: Secondary | ICD-10-CM | POA: Diagnosis not present

## 2024-06-11 DIAGNOSIS — I679 Cerebrovascular disease, unspecified: Secondary | ICD-10-CM | POA: Diagnosis not present

## 2024-06-11 DIAGNOSIS — D696 Thrombocytopenia, unspecified: Secondary | ICD-10-CM | POA: Diagnosis not present

## 2024-11-13 ENCOUNTER — Encounter (HOSPITAL_COMMUNITY): Payer: Self-pay

## 2025-03-10 ENCOUNTER — Ambulatory Visit: Admitting: Endocrinology
